# Patient Record
Sex: Female | Born: 1990 | Race: Black or African American | Hispanic: No | Marital: Single | Smoking: Never smoker
Health system: Southern US, Community
[De-identification: ages and names within clinical notes are randomized; demographics above are authoritative.]

## PROBLEM LIST (undated history)

## (undated) DIAGNOSIS — F410 Panic disorder [episodic paroxysmal anxiety] without agoraphobia: Secondary | ICD-10-CM

## (undated) DIAGNOSIS — F419 Anxiety disorder, unspecified: Secondary | ICD-10-CM

## (undated) DIAGNOSIS — T7840XA Allergy, unspecified, initial encounter: Secondary | ICD-10-CM

## (undated) HISTORY — DX: Allergy, unspecified, initial encounter: T78.40XA

## (undated) HISTORY — PX: NO PAST SURGERIES: SHX2092

## (undated) HISTORY — DX: Anxiety disorder, unspecified: F41.9

## (undated) HISTORY — DX: Panic disorder (episodic paroxysmal anxiety): F41.0

---

## 2012-06-03 ENCOUNTER — Encounter (HOSPITAL_COMMUNITY): Payer: Self-pay | Admitting: Emergency Medicine

## 2012-06-03 ENCOUNTER — Emergency Department (HOSPITAL_COMMUNITY)
Admission: EM | Admit: 2012-06-03 | Discharge: 2012-06-03 | Disposition: A | Discharge status: Home / Self Care | Payer: Self-pay | Attending: Emergency Medicine | Admitting: Emergency Medicine

## 2012-06-03 DIAGNOSIS — L290 Pruritus ani: Secondary | ICD-10-CM | POA: Insufficient documentation

## 2012-06-03 DIAGNOSIS — R21 Rash and other nonspecific skin eruption: Secondary | ICD-10-CM | POA: Insufficient documentation

## 2012-06-03 MED ORDER — DIPHENHYDRAMINE HCL 25 MG PO TABS: 25.0000 mg | ORAL_TABLET | Freq: Four times a day (QID) | ORAL | Status: DC

## 2012-06-03 MED ORDER — HYDROCORTISONE 1 % EX CREA: TOPICAL_CREAM | Freq: Two times a day (BID) | CUTANEOUS | Status: DC

## 2012-06-03 NOTE — ED Notes (Signed)
Pt reports noticing a rash 2-3 days ago, pt noticed rash to hand and chest and back; c/o itching

## 2012-06-03 NOTE — ED Provider Notes (Signed)
History   This chart was scribed for Tobin Chad, MD by Albertha Ghee Rifaie. This patient was seen in room TR10C/TR10C and the patient's care was started at 6:36 PM.   CSN: [id number]  Arrival date & time 06/03/12  1547   None     Chief Complaint  Patient presents with  . Rash     The history is provided by the patient. No language interpreter was used.    Courtney Mcdaniel is a 21 y.o. female who presents to the Emergency Department complaining of 3 days of rash on hands, chest and back associated with itching. She reports having a  Family hx of rash; her husband has rash on his sleeve arm. She states that it is not an infection.  She denies fever, chills, emesis, and nausea as associated symptoms. Pt denies smoking but states alcohol use. She denies having any new health problem or change in behavior. She has not been hospitalized before and is not taking any medications currently. She is concerned that these areas may be secondary to mosquito bites or bedbugs.  She reports no history of insect infestations of her home.   History reviewed. No pertinent past medical history.  History reviewed. No pertinent past surgical history.  History reviewed. No pertinent family history.  History  Substance Use Topics  . Smoking status: Never Smoker   . Smokeless tobacco: Not on file  . Alcohol Use: Yes     1 -2 glasses of wine daily   NO OB history provided.   Review of Systems  Constitutional: Negative.   HENT: Negative.   Eyes: Negative.   Respiratory: Negative.   Cardiovascular: Negative.   Gastrointestinal: Negative.   Genitourinary: Negative.   Musculoskeletal: Negative.   Skin: Positive for rash.  Neurological: Negative.   Hematological: Negative.   Psychiatric/Behavioral: Negative.   All other systems reviewed and are negative.    Allergies  Review of patient's allergies indicates no known allergies.  Home Medications  No current outpatient prescriptions on  file.  BP 113/72  Pulse 70  Temp 97.6 F (36.4 C) (Oral)  Resp 18  SpO2 99%  LMP 05/29/2012  Physical Exam  Constitutional: She is oriented to person, place, and time. She appears well-developed and well-nourished. No distress.  HENT:  Head: Normocephalic and atraumatic. Head is without raccoon's eyes, without contusion, without right periorbital erythema and without left periorbital erythema. No trismus in the jaw.  Right Ear: External ear normal. No mastoid tenderness.  Left Ear: External ear normal. No mastoid tenderness.  Nose: Nose normal. No mucosal edema, rhinorrhea or nasal septal hematoma.  Mouth/Throat: Uvula is midline, oropharynx is clear and moist and mucous membranes are normal. Mucous membranes are not pale, not dry and not cyanotic. She does not have dentures. No oral lesions. Normal dentition. No dental abscesses, uvula swelling, lacerations or dental caries. No oropharyngeal exudate, posterior oropharyngeal edema, posterior oropharyngeal erythema or tonsillar abscesses.  Eyes: EOM are normal. Pupils are equal, round, and reactive to light. Right eye exhibits no discharge. Left eye exhibits no discharge. No scleral icterus.  Neck: Normal range of motion. Neck supple. No JVD present. No tracheal deviation present. No thyromegaly present.  Cardiovascular: Normal rate, regular rhythm, normal heart sounds and intact distal pulses.  Exam reveals no gallop and no friction rub.   No murmur heard. Pulmonary/Chest: Effort normal and breath sounds normal. No stridor. No respiratory distress. She has no wheezes. She has no rales. She exhibits no  tenderness.  Abdominal: She exhibits no distension and no mass. There is no tenderness. There is no rebound and no guarding.  Genitourinary: Guaiac negative stool. No vaginal discharge found.  Musculoskeletal: Normal range of motion. She exhibits no edema and no tenderness.  Lymphadenopathy:       Head (right side): No submental, no  submandibular, no tonsillar, no preauricular, no posterior auricular and no occipital adenopathy present.       Head (left side): No submental, no submandibular, no tonsillar, no preauricular, no posterior auricular and no occipital adenopathy present.    She has no cervical adenopathy.  Neurological: She is alert and oriented to person, place, and time. She displays normal reflexes. No cranial nerve deficit. She exhibits normal muscle tone. Coordination normal. GCS eye subscore is 4. GCS verbal subscore is 5. GCS motor subscore is 6.  Skin: Skin is warm. Rash noted. She is not diaphoretic. No erythema. No pallor.  Psychiatric: She has a normal mood and affect. Her behavior is normal. Judgment normal.    ED Course  Procedures (including critical care time)  Labs Reviewed - No data to display No results found.  DIAGNOSTIC STUDIES: Oxygen Saturation is 99% on room air, normal by my interpretation.    COORDINATION OF CARE: 6:40 PM Discussed treatment plan with pt at bedside and pt agreed to plan.    No diagnosis found.    MDM  Pt presents for evaluation of a rash.  She has several very small, not well circumscribed, reddened areas of skin on her right arm/hand, the side of her neck, and trunk.  There are no lesions on the palms or soles.  The rash does not appear to be urticarial or cellulitic.  She has no adenopathy or evidence of systemic illness.  Plan close outpt follow-up and prn benadryl.  She has been advised to return to the emergency department if the areas increase in size or if it migrates.      I personally performed the services described in this documentation, which was scribed in my presence. The recorded information has been reviewed and considered. Tobin Chad, MD 06/03/12 2027

## 2012-08-08 ENCOUNTER — Emergency Department (HOSPITAL_COMMUNITY)
Admission: EM | Admit: 2012-08-08 | Discharge: 2012-08-08 | Disposition: A | Discharge status: Home / Self Care | Payer: BC Managed Care – PPO | Attending: Emergency Medicine | Admitting: Emergency Medicine

## 2012-08-08 ENCOUNTER — Encounter (HOSPITAL_COMMUNITY): Payer: Self-pay | Admitting: Nurse Practitioner

## 2012-08-08 DIAGNOSIS — J3489 Other specified disorders of nose and nasal sinuses: Secondary | ICD-10-CM | POA: Insufficient documentation

## 2012-08-08 DIAGNOSIS — J069 Acute upper respiratory infection, unspecified: Secondary | ICD-10-CM | POA: Insufficient documentation

## 2012-08-08 MED ORDER — GUAIFENESIN-CODEINE 100-10 MG/5ML PO SOLN
5.0000 mL | Freq: Once | ORAL | Status: AC
  Administered 2012-08-08: 5 mL via ORAL
  Filled 2012-08-08: qty 5

## 2012-08-08 NOTE — ED Notes (Signed)
States "coughing up green stuff" since yesterday. Body aches Thursday that have resolved. Denies any other complaints beside cough today

## 2012-08-11 NOTE — ED Provider Notes (Signed)
History    22 year old female with cough and congestion. Onset yesterday. Mrs. preceded by having body aches which has since resolved. No fever or chills. No nausea or vomiting. No unusual leg pain or swelling. No sick contacts. No rash. No history of underlying lung disease. Nonsmoker.   CSN: [id number]  Arrival date & time 08/08/12  1307   First MD Initiated Contact with Patient 08/08/12 1351      Chief Complaint  Patient presents with  . URI    (Consider location/radiation/quality/duration/timing/severity/associated sxs/prior treatment) HPI  History reviewed. No pertinent past medical history.  History reviewed. No pertinent past surgical history.  History reviewed. No pertinent family history.  History  Substance Use Topics  . Smoking status: Never Smoker   . Smokeless tobacco: Not on file  . Alcohol Use: Yes     Comment: 1 -2 glasses of wine daily    OB History    Grav Para Term Preterm Abortions TAB SAB Ect Mult Living                  Review of Systems  All systems reviewed and negative, other than as noted in HPI.   Allergies  Review of patient's allergies indicates no known allergies.  Home Medications  No current outpatient prescriptions on file.  BP 110/66  Pulse 102  Temp 97.9 F (36.6 C) (Oral)  Resp 16  SpO2 100%  LMP 08/01/2012  Physical Exam  Nursing note and vitals reviewed. Constitutional: She appears well-developed and well-nourished. No distress.  HENT:  Head: Normocephalic and atraumatic.  Eyes: Conjunctivae normal are normal. Right eye exhibits no discharge. Left eye exhibits no discharge.  Neck: Neck supple.  Cardiovascular: Normal rate, regular rhythm and normal heart sounds.  Exam reveals no gallop and no friction rub.   No murmur heard. Pulmonary/Chest: Effort normal and breath sounds normal. No respiratory distress.  Abdominal: Soft. She exhibits no distension. There is no tenderness.  Musculoskeletal: She exhibits no  edema and no tenderness.       Lower extremities symmetric as compared to each other. No calf tenderness. Negative Homan's. No palpable cords.   Neurological: She is alert.  Skin: Skin is warm and dry.  Psychiatric: She has a normal mood and affect. Her behavior is normal. Thought content normal.    ED Course  Procedures (including critical care time)  Labs Reviewed - No data to display No results found.   1. Viral URI       MDM  22 year old female with likely viral upper respiratory tract infection. Well appearing. No respiratory distress. Oxygen saturations 100% on room air. Very low suspicion for serious bacterial illness. Doubt pulmonary embolism. Doubt pneumothorax. Plan symptomatic treatment. Return precautions discussed.        Raeford Razor, MD 08/11/12 [phone]

## 2014-10-05 ENCOUNTER — Encounter (HOSPITAL_COMMUNITY): Payer: Self-pay | Admitting: *Deleted

## 2014-10-05 ENCOUNTER — Emergency Department (HOSPITAL_COMMUNITY)
Admission: EM | Admit: 2014-10-05 | Discharge: 2014-10-05 | Disposition: A | Discharge status: Home / Self Care | Payer: BLUE CROSS/BLUE SHIELD | Attending: Emergency Medicine | Admitting: Emergency Medicine

## 2014-10-05 DIAGNOSIS — F410 Panic disorder [episodic paroxysmal anxiety] without agoraphobia: Secondary | ICD-10-CM

## 2014-10-05 MED ORDER — HYDROXYZINE HCL 25 MG PO TABS: 25.0000 mg | ORAL_TABLET | Freq: Three times a day (TID) | ORAL | Status: DC | PRN

## 2014-10-05 NOTE — Discharge Instructions (Signed)
Your symptoms may be related to an anxiety attack.  However, follow up with your doctor for further evaluation including a thyroid evaluation.  Take Hydroxyzine as needed if your symptom return.   Panic Attacks Panic attacks are sudden, short-livedsurges of severe anxiety, fear, or discomfort. They may occur for no reason when you are relaxed, when you are anxious, or when you are sleeping. Panic attacks may occur for a number of reasons:   Healthy people occasionally have panic attacks in extreme, life-threatening situations, such as war or natural disasters. Normal anxiety is a protective mechanism of the body that helps us react to danger (fight or flight response).  Panic attacks are often seen with anxiety disorders, such as panic disorder, social anxiety disorder, generalized anxiety disorder, and phobias. Anxiety disorders cause excessive or uncontrollable anxiety. They may interfere with your relationships or other life activities.  Panic attacks are sometimes seen with other mental illnesses, such as depression and posttraumatic stress disorder.  Certain medical conditions, prescription medicines, and drugs of abuse can cause panic attacks. SYMPTOMS  Panic attacks start suddenly, peak within 20 minutes, and are accompanied by four or more of the following symptoms:  Pounding heart or fast heart rate (palpitations).  Sweating.  Trembling or shaking.  Shortness of breath or feeling smothered.  Feeling choked.  Chest pain or discomfort.  Nausea or strange feeling in your stomach.  Dizziness, light-headedness, or feeling like you will faint.  Chills or hot flushes.  Numbness or tingling in your lips or hands and feet.  Feeling that things are not real or feeling that you are not yourself.  Fear of losing control or going crazy.  Fear of dying. Some of these symptoms can mimic serious medical conditions. For example, you may think you are having a heart attack. Although  panic attacks can be very scary, they are not life threatening. DIAGNOSIS  Panic attacks are diagnosed through an assessment by your health care provider. Your health care provider will ask questions about your symptoms, such as where and when they occurred. Your health care provider will also ask about your medical history and use of alcohol and drugs, including prescription medicines. Your health care provider may order blood tests or other studies to rule out a serious medical condition. Your health care provider may refer you to a mental health professional for further evaluation. TREATMENT   Most healthy people who have one or two panic attacks in an extreme, life-threatening situation will not require treatment.  The treatment for panic attacks associated with anxiety disorders or other mental illness typically involves counseling with a mental health professional, medicine, or a combination of both. Your health care provider will help determine what treatment is best for you.  Panic attacks due to physical illness usually go away with treatment of the illness. If prescription medicine is causing panic attacks, talk with your health care provider about stopping the medicine, decreasing the dose, or substituting another medicine.  Panic attacks due to alcohol or drug abuse go away with abstinence. Some adults need professional help in order to stop drinking or using drugs. HOME CARE INSTRUCTIONS   Take all medicines as directed by your health care provider.   Schedule and attend follow-up visits as directed by your health care provider. It is important to keep all your appointments. SEEK MEDICAL CARE IF:  You are not able to take your medicines as prescribed.  Your symptoms do not improve or get worse. SEEK IMMEDIATE MEDICAL CARE  IF:   You experience panic attack symptoms that are different than your usual symptoms.  You have serious thoughts about hurting yourself or others.  You  are taking medicine for panic attacks and have a serious side effect. MAKE SURE YOU:  Understand these instructions.  Will watch your condition.  Will get help right away if you are not doing well or get worse. Document Released: 07/22/2005 Document Revised: 07/27/2013 Document Reviewed: 03/05/2013 [facility] Patient Information 2015 [place], [place]. This information is not intended to replace advice given to you by your health care provider. Make sure you discuss any questions you have with your health care provider.

## 2014-10-05 NOTE — ED Notes (Signed)
Patient presents via EMS  EMS reports upon their arrival patient was hyperventilating with arm tingling  Skin was warm and dry

## 2014-10-05 NOTE — ED Notes (Signed)
Pt. Admits to a family member passing away this past Friday as a stressor in pt.'s life

## 2014-10-05 NOTE — ED Provider Notes (Signed)
CSN: [id number]     Arrival date & time 10/05/14  0245 History   First MD Initiated Contact with Patient 10/05/14 [phone]     Chief Complaint  Patient presents with  . Panic Attack     (Consider location/radiation/quality/duration/timing/severity/associated sxs/prior Treatment) HPI   Generally healthy 24 year old female presents for evaluation of heart palpitation. Patient report last night she was using the bathroom when she experiencing heart racing, a sharp pain to the jaw, and having transient shortness of breath. Symptom seems to last approximately 30 minutes and resolved. She also report having recent past having difficulty catching her breath in excluding significant tearing sensation throughout her arms. She admits that she has similar panic attack in the past. She mentioned that a family member recently passed away but she does not think her heart palpitation is related to that. She however reports she had a dream about her family member that has passed prior to her reported episode.  She denies fever, chills, headache, lightheadedness, dizziness, chest pressure, URI symptoms, productive cough, back pain, abdominal pain, nausea vomiting diarrhea. She denies any history of thyroid disease or heart disease. She denies any recent caffeine use. She has not been sexually active for nearly a year. She denies any prior history of PE or DVT, no recent surgery, prolonged bed rest, unilateral leg swelling or calf pain, hemoptysis, or taking oral birth control. Symptom has resolved.  History reviewed. No pertinent past medical history. History reviewed. No pertinent past surgical history. No family history on file. History  Substance Use Topics  . Smoking status: Never Smoker   . Smokeless tobacco: Never Used  . Alcohol Use: Yes     Comment: 1 -2 glasses of wine daily   OB History    No data available     Review of Systems  All other systems reviewed and are negative.     Allergies   Review of patient's allergies indicates no known allergies.  Home Medications   Prior to Admission medications   Medication Sig Start Date End Date Taking? Authorizing Provider  guaiFENesin (MUCINEX) 600 MG 12 hr tablet Take 600 mg by mouth 2 (two) times daily as needed for cough or to loosen phlegm.   Yes Historical Provider, MD   BP 108/77 mmHg  Pulse 75  Temp(Src) 97.6 F (36.4 C) (Oral)  Resp 18  Ht  (1.575 m)  Wt 115 lb (52.164 kg)  BMI 21.03 kg/m2  SpO2 100%  LMP 09/19/2014 Physical Exam  Constitutional: She is oriented to person, place, and time. She appears well-developed and well-nourished. No distress.  HENT:  Head: Atraumatic.  Eyes: Conjunctivae are normal.  Neck: Neck supple.  Cardiovascular: Normal rate, regular rhythm and intact distal pulses.  Exam reveals no gallop and no friction rub.   No murmur heard. Pulmonary/Chest: Effort normal and breath sounds normal.  Abdominal: Soft. There is no tenderness.  Musculoskeletal: She exhibits no edema.  Neurological: She is alert and oriented to person, place, and time.  Skin: No rash noted.  Psychiatric: She has a normal mood and affect. Her speech is normal and behavior is normal. Thought content normal.  Nursing note and vitals reviewed.   ED Course  Procedures (including critical care time)  6:18 AM Patient experiencing a brief period of heart palpitation, arm tingling, and hyperventilation that is suspicious of an anxiety or panic attack.  sxs has resolved. Precipitating factor likely from recent death in the family. No hx of thyroid disease.  No  cardiac disease.  6:54 AM ECG without arrhthymias.  Reassurance given. Hydroxyzine prescribed to use as needed. Recommend follow-up closely with PCP for outpatient evaluation including thyroid study. Return precautions discussed. Pt is PERC negative.  Care discussed with Dr. Norlene Campbelltter.  Labs Review Labs Reviewed - No data to display  Imaging Review No results  found.   EKG Interpretation   Date/Time:  Wednesday October 05 2014 06:26:17 EST Ventricular Rate:  83 PR Interval:  141 QRS Duration: 73 QT Interval:  373 QTC Calculation: 438 R Axis:   68 Text Interpretation:  Sinus rhythm Probable left atrial enlargement Low  voltage, precordial leads Confirmed by OTTER  MD, OLGA ([id number]) on 10/05/2014  6:48:50 AM      MDM   Final diagnoses:  Panic attack    BP 108/77 mmHg  Pulse 75  Temp(Src) 97.6 F (36.4 C) (Oral)  Resp 18  Ht 5\' 2"  (1.575 m)  Wt 115 lb (52.164 kg)  BMI 21.03 kg/m2  SpO2 100%  LMP 09/19/2014     Fayrene HelperBowie Mariyanna Mucha, PA-C 10/05/14 0703  Olivia Mackielga M Otter, MD 10/05/14 [phone]

## 2015-09-19 ENCOUNTER — Other Ambulatory Visit (HOSPITAL_COMMUNITY)
Admission: RE | Admit: 2015-09-19 | Discharge: 2015-09-19 | Disposition: A | Discharge status: Home / Self Care | Payer: Self-pay | Source: Ambulatory Visit | Attending: Emergency Medicine | Admitting: Emergency Medicine

## 2015-09-19 ENCOUNTER — Encounter (HOSPITAL_COMMUNITY): Payer: Self-pay | Admitting: Emergency Medicine

## 2015-09-19 ENCOUNTER — Emergency Department (INDEPENDENT_AMBULATORY_CARE_PROVIDER_SITE_OTHER)
Admission: EM | Admit: 2015-09-19 | Discharge: 2015-09-19 | Disposition: A | Discharge status: Home / Self Care | Payer: Self-pay | Source: Home / Self Care | Attending: Emergency Medicine | Admitting: Emergency Medicine

## 2015-09-19 DIAGNOSIS — N390 Urinary tract infection, site not specified: Secondary | ICD-10-CM

## 2015-09-19 LAB — POCT PREGNANCY, URINE: Preg Test, Ur: NEGATIVE

## 2015-09-19 LAB — POCT URINALYSIS DIP (DEVICE)
Bilirubin Urine: NEGATIVE
GLUCOSE, UA: NEGATIVE mg/dL
Hgb urine dipstick: NEGATIVE
Ketones, ur: NEGATIVE mg/dL
LEUKOCYTES UA: NEGATIVE
NITRITE: NEGATIVE
PROTEIN: NEGATIVE mg/dL
Specific Gravity, Urine: 1.01 (ref 1.005–1.030)
UROBILINOGEN UA: 0.2 mg/dL (ref 0.0–1.0)
pH: 7 (ref 5.0–8.0)

## 2015-09-19 MED ORDER — CEPHALEXIN 500 MG PO CAPS: 500.0000 mg | ORAL_CAPSULE | Freq: Four times a day (QID) | ORAL | Status: DC

## 2015-09-19 NOTE — ED Notes (Signed)
uti check, onset 4/11.  Reports stinging

## 2015-09-19 NOTE — Discharge Instructions (Signed)
You likely have a urinary tract infection. Take Keflex 4 times a day for 3 days. Make sure you are drinking plenty of water. Follow-up as needed.

## 2015-09-19 NOTE — ED Provider Notes (Signed)
CSN: [id number]     Arrival date & time 09/19/15  1343 History   First MD Initiated Contact with Patient 09/19/15 1424     Chief Complaint  Patient presents with  . Urinary Tract Infection   (Consider location/radiation/quality/duration/timing/severity/associated sxs/prior Treatment) HPI  She is a 25 year old woman here for evaluation of dysuria. She states she had some dysuria on Sunday. This is associated with some urinary urgency. No abdominal pain, nausea, vomiting, flank pain, fever, or chills. She has been drinking cranberry juice and water. She states symptoms got a little bit better yesterday, but then again had dysuria today. She states she has had one UTI previously when she was 25 years old.  No vaginal discharge.  History reviewed. No pertinent past medical history. History reviewed. No pertinent past surgical history. Family History  Problem Relation Age of Onset  . Diabetes Father    Social History  Substance Use Topics  . Smoking status: Never Smoker   . Smokeless tobacco: Never Used  . Alcohol Use: Yes     Comment: 1 -2 glasses of wine daily   OB History    No data available     Review of Systems As in history of present illness Allergies  Review of patient's allergies indicates no known allergies.  Home Medications   Prior to Admission medications   Medication Sig Start Date End Date Taking? Authorizing Provider  cephALEXin (KEFLEX) 500 MG capsule Take 1 capsule (500 mg total) by mouth 4 (four) times daily. 09/19/15   Charm Rings, MD  guaiFENesin (MUCINEX) 600 MG 12 hr tablet Take 600 mg by mouth 2 (two) times daily as needed for cough or to loosen phlegm.    Historical Provider, MD  hydrOXYzine (ATARAX/VISTARIL) 25 MG tablet Take 1 tablet (25 mg total) by mouth every 8 (eight) hours as needed for anxiety. 10/05/14   Fayrene Helper, PA-C   Meds Ordered and Administered this Visit  Medications - No data to display  BP 98/70 mmHg  Pulse 64  Temp(Src) 98.3 F  (36.8 C) (Oral)  Resp 16  SpO2 100%  LMP 09/08/2015 No data found.   Physical Exam  Constitutional: She is oriented to person, place, and time. She appears well-developed and well-nourished. No distress.  Cardiovascular: Normal rate.   Pulmonary/Chest: Effort normal.  Abdominal: Soft. Bowel sounds are normal. She exhibits no distension. There is no tenderness. There is no rebound and no guarding.  No CVA tenderness  Neurological: She is alert and oriented to person, place, and time.    ED Course  Procedures (including critical care time)  Labs Review Labs Reviewed  URINE CULTURE  POCT URINALYSIS DIP (DEVICE)  POCT PREGNANCY, URINE    Imaging Review No results found.    MDM   1. UTI (lower urinary tract infection)    UA is clear, but she has been drinking a lot of water and cranberry juice. Based on her symptoms she does have a UTI. Will treat with Keflex. Urine sent for culture.    Charm Rings, MD 09/19/15 1500

## 2015-09-21 ENCOUNTER — Telehealth (HOSPITAL_COMMUNITY): Payer: Self-pay | Admitting: Emergency Medicine

## 2015-09-21 LAB — URINE CULTURE

## 2015-09-21 NOTE — ED Notes (Signed)
Called pt and notified of recent lab results from visit 2/14 Pt ID'd properly... Reports feeling better and sx have subsided Will finish keflex tomorrow... Has 7 pills left... Adv pt to finish   Per Dr. Dayton Scrape,  Urine culture does not clearly demonstrate urinary tract infection. Finish keflex (cephalexin) prescription given at urgent care on 09/19/15.  Recheck or followup pcp/Tiffany Danelle Earthly for persistent symptoms. Ria Clock MD  Adv pt if sx are not getting better to return  Pt verb understanding.

## 2016-03-05 ENCOUNTER — Ambulatory Visit (HOSPITAL_COMMUNITY)
Admission: EM | Admit: 2016-03-05 | Discharge: 2016-03-05 | Disposition: A | Discharge status: Home / Self Care | Payer: Self-pay | Attending: Family Medicine | Admitting: Family Medicine

## 2016-03-05 ENCOUNTER — Encounter (HOSPITAL_COMMUNITY): Payer: Self-pay | Admitting: Emergency Medicine

## 2016-03-05 DIAGNOSIS — N3090 Cystitis, unspecified without hematuria: Secondary | ICD-10-CM

## 2016-03-05 LAB — POCT URINALYSIS DIP (DEVICE)
BILIRUBIN URINE: NEGATIVE
GLUCOSE, UA: NEGATIVE mg/dL
KETONES UR: NEGATIVE mg/dL
LEUKOCYTES UA: NEGATIVE
Nitrite: NEGATIVE
Protein, ur: NEGATIVE mg/dL
SPECIFIC GRAVITY, URINE: 1.015 (ref 1.005–1.030)
UROBILINOGEN UA: 0.2 mg/dL (ref 0.0–1.0)
pH: 6 (ref 5.0–8.0)

## 2016-03-05 LAB — POCT PREGNANCY, URINE: Preg Test, Ur: NEGATIVE

## 2016-03-05 MED ORDER — CEPHALEXIN 500 MG PO CAPS: 500.0000 mg | ORAL_CAPSULE | Freq: Four times a day (QID) | ORAL | 0 refills | Status: DC

## 2016-03-05 NOTE — Discharge Instructions (Signed)
Take all of medicine as directed, drink lots of fluids, see your doctor if further problems. °

## 2016-03-05 NOTE — ED Provider Notes (Signed)
[facility]    CSN: [id number] Arrival date & time: 03/05/16  1204  First Provider Contact:  First MD Initiated Contact with Patient 03/05/16 1308        History   Chief Complaint Chief Complaint  Patient presents with  . Urinary Tract Infection    HPI Courtney Mcdaniel is a 25 y.o. female.    Urinary Frequency  This is a new problem. The current episode started more than 1 week ago. The problem has been gradually worsening. Pertinent negatives include no abdominal pain.    History reviewed. No pertinent past medical history.  There are no active problems to display for this patient.   History reviewed. No pertinent surgical history.  OB History    No data available       Home Medications    Prior to Admission medications   Medication Sig Start Date End Date Taking? Authorizing Provider  cephALEXin (KEFLEX) 500 MG capsule Take 1 capsule (500 mg total) by mouth 4 (four) times daily. Take all of medicine and drink lots of fluids 03/05/16   Linna Hoff, MD  guaiFENesin (MUCINEX) 600 MG 12 hr tablet Take 600 mg by mouth 2 (two) times daily as needed for cough or to loosen phlegm.    Historical Provider, MD  hydrOXYzine (ATARAX/VISTARIL) 25 MG tablet Take 1 tablet (25 mg total) by mouth every 8 (eight) hours as needed for anxiety. 10/05/14   Fayrene Helper, PA-C    Family History Family History  Problem Relation Age of Onset  . Diabetes Father     Social History Social History  Substance Use Topics  . Smoking status: Never Smoker  . Smokeless tobacco: Never Used  . Alcohol use Yes     Comment: 1 -2 glasses of wine daily     Allergies   Review of patient's allergies indicates no known allergies.   Review of Systems Review of Systems  Constitutional: Negative.  Negative for chills and fever.  Gastrointestinal: Negative.  Negative for abdominal pain, nausea and vomiting.  Genitourinary: Positive for dysuria, frequency and urgency. Negative for flank  pain, hematuria, pelvic pain and vaginal discharge.  All other systems reviewed and are negative.    Physical Exam Triage Vital Signs ED Triage Vitals [03/05/16 1227]  Enc Vitals Group     BP 100/68     Pulse Rate 67     Resp      Temp 97.8 F (36.6 C)     Temp Source Oral     SpO2 98 %     Weight      Height      Head Circumference      Peak Flow      Pain Score      Pain Loc      Pain Edu?      Excl. in GC?    No data found.   Updated Vital Signs BP 100/68 (BP Location: Left Arm)   Pulse 67   Temp 97.8 F (36.6 C) (Oral)   SpO2 98%   Visual Acuity Right Eye Distance:   Left Eye Distance:   Bilateral Distance:    Right Eye Near:   Left Eye Near:    Bilateral Near:     Physical Exam  Constitutional: She is oriented to person, place, and time. She appears well-developed and well-nourished. No distress.  Abdominal: Soft. Bowel sounds are normal. She exhibits no mass. There is no tenderness.  Neurological: She is alert  and oriented to person, place, and time.  Skin: Skin is warm and dry.  Nursing note and vitals reviewed.    UC Treatments / Results  Labs (all labs ordered are listed, but only abnormal results are displayed) Labs Reviewed  POCT URINALYSIS DIP (DEVICE) - Abnormal; Notable for the following:       Result Value   Hgb urine dipstick TRACE (*)    All other components within normal limits  POCT PREGNANCY, URINE    EKG  EKG Interpretation None       Radiology No results found.  Procedures Procedures (including critical care time)  Medications Ordered in UC Medications - No data to display   Initial Impression / Assessment and Plan / UC Course  I have reviewed the triage vital signs and the nursing notes.  Pertinent labs & imaging results that were available during my care of the patient were reviewed by me and considered in my medical decision making (see chart for details).  Clinical Course     Final Clinical  Impressions(s) / UC Diagnoses   Final diagnoses:  Cystitis    New Prescriptions Discharge Medication List as of 03/05/2016  1:41 PM       Linna Hoff, MD 03/12/16 2048

## 2016-11-04 ENCOUNTER — Encounter (HOSPITAL_COMMUNITY): Payer: Self-pay | Admitting: Emergency Medicine

## 2016-11-04 ENCOUNTER — Ambulatory Visit (HOSPITAL_COMMUNITY)
Admission: EM | Admit: 2016-11-04 | Discharge: 2016-11-04 | Disposition: A | Discharge status: Home / Self Care | Payer: Self-pay | Attending: Family Medicine | Admitting: Family Medicine

## 2016-11-04 DIAGNOSIS — R6889 Other general symptoms and signs: Secondary | ICD-10-CM

## 2016-11-04 MED ORDER — BUTALBITAL-APAP-CAFFEINE 50-325-40 MG PO TABS: 1.0000 | ORAL_TABLET | Freq: Four times a day (QID) | ORAL | 0 refills | Status: AC | PRN

## 2016-11-04 MED ORDER — IPRATROPIUM BROMIDE 0.03 % NA SOLN: 2.0000 | Freq: Two times a day (BID) | NASAL | 0 refills | Status: DC

## 2016-11-04 NOTE — ED Triage Notes (Signed)
Onset Thursday, muscle aches, Friday legs ached.  Yesterday had headaches and coughing and runny nose.

## 2016-11-04 NOTE — ED Provider Notes (Signed)
[facility]    CSN: [id number] Arrival date & time: 11/04/16  1219     History   Chief Complaint Chief Complaint  Patient presents with  . URI    HPI Courtney Mcdaniel is a 26 y.o. female.   Onset Thursday, 4 days ago, patient developed muscle aches, and on Friday her legs ached.  Yesterday had headaches and coughing and runny nose.  She's also had a cough.        History reviewed. No pertinent past medical history.  There are no active problems to display for this patient.   History reviewed. No pertinent surgical history.  OB History    No data available       Home Medications    Prior to Admission medications   Medication Sig Start Date End Date Taking? Authorizing Provider  Phenyleph-Doxylamine-DM-APAP (ALKA-SELTZER PLS NIGHT CLD/FLU PO) Take by mouth.   Yes Historical Provider, MD  butalbital-acetaminophen-caffeine (FIORICET, ESGIC) 50-325-40 MG tablet Take 1-2 tablets by mouth every 6 (six) hours as needed for headache. 11/04/16 11/04/17  Elvina Sidle, MD  hydrOXYzine (ATARAX/VISTARIL) 25 MG tablet Take 1 tablet (25 mg total) by mouth every 8 (eight) hours as needed for anxiety. 10/05/14   Fayrene Helper, PA-C  ipratropium (ATROVENT) 0.03 % nasal spray Place 2 sprays into both nostrils 2 (two) times daily. 11/04/16   Elvina Sidle, MD    Family History Family History  Problem Relation Age of Onset  . Diabetes Father     Social History Social History  Substance Use Topics  . Smoking status: Never Smoker  . Smokeless tobacco: Never Used  . Alcohol use No     Comment: 1 -2 glasses of wine daily     Allergies   Patient has no known allergies.   Review of Systems Review of Systems  Constitutional: Negative.   HENT: Positive for rhinorrhea.   Respiratory: Positive for cough.   Gastrointestinal: Negative.   Musculoskeletal: Positive for myalgias.  Neurological: Positive for headaches.  All other systems reviewed and are  negative.    Physical Exam Triage Vital Signs ED Triage Vitals [11/04/16 1312]  Enc Vitals Group     BP 109/76     Pulse Rate (!) 102     Resp (!) 24     Temp 98.6 F (37 C)     Temp Source Oral     SpO2 99 %     Weight      Height      Head Circumference      Peak Flow      Pain Score 9     Pain Loc      Pain Edu?      Excl. in GC?    No data found.   Updated Vital Signs BP 109/76 (BP Location: Right Arm)   Pulse (!) 102 Comment: notified rn  Temp 98.6 F (37 C) (Oral)   Resp (!) 24 Comment: notified rn  LMP 10/22/2016   SpO2 99%    Physical Exam  Constitutional: She is oriented to person, place, and time. She appears well-developed and well-nourished.  HENT:  Right Ear: External ear normal.  Left Ear: External ear normal.  Mouth/Throat: Oropharynx is clear and moist.  Eyes: Conjunctivae and EOM are normal. Pupils are equal, round, and reactive to light.  Neck: Normal range of motion. Neck supple.  Cardiovascular: Normal rate, regular rhythm and normal heart sounds.   Pulmonary/Chest: Effort normal and breath sounds normal.  Musculoskeletal: Normal range of motion.  Neurological: She is alert and oriented to person, place, and time. No cranial nerve deficit or sensory deficit. She exhibits normal muscle tone. Coordination normal.  Skin: Skin is warm and dry.  Nursing note and vitals reviewed.    UC Treatments / Results  Labs (all labs ordered are listed, but only abnormal results are displayed) Labs Reviewed - No data to display  EKG  EKG Interpretation None       Radiology No results found.  Procedures Procedures (including critical care time)  Medications Ordered in UC Medications - No data to display   Initial Impression / Assessment and Plan / UC Course  I have reviewed the triage vital signs and the nursing notes.  Pertinent labs & imaging results that were available during my care of the patient were reviewed by me and considered  in my medical decision making (see chart for details).     Final Clinical Impressions(s) / UC Diagnoses   Final diagnoses:  Flu-like symptoms    New Prescriptions New Prescriptions   BUTALBITAL-ACETAMINOPHEN-CAFFEINE (FIORICET, ESGIC) 50-325-40 MG TABLET    Take 1-2 tablets by mouth every 6 (six) hours as needed for headache.   IPRATROPIUM (ATROVENT) 0.03 % NASAL SPRAY    Place 2 sprays into both nostrils 2 (two) times daily.     Elvina Sidle, MD 11/04/16 1340

## 2017-07-16 ENCOUNTER — Encounter (HOSPITAL_COMMUNITY): Payer: Self-pay | Admitting: Emergency Medicine

## 2017-07-16 ENCOUNTER — Ambulatory Visit (HOSPITAL_COMMUNITY)
Admission: EM | Admit: 2017-07-16 | Discharge: 2017-07-16 | Disposition: A | Discharge status: Home / Self Care | Payer: BLUE CROSS/BLUE SHIELD | Attending: Family Medicine | Admitting: Family Medicine

## 2017-07-16 DIAGNOSIS — S0990XA Unspecified injury of head, initial encounter: Secondary | ICD-10-CM | POA: Diagnosis not present

## 2017-07-16 MED ORDER — DICLOFENAC SODIUM 75 MG PO TBEC: 75.0000 mg | DELAYED_RELEASE_TABLET | Freq: Two times a day (BID) | ORAL | 0 refills | Status: DC

## 2017-07-16 NOTE — Discharge Instructions (Signed)
Please seek prompt medical care if: You have: A very bad (severe) headache that is not helped by medicine. Trouble walking or weakness in your arms and legs. Clear or bloody fluid coming from your nose or ears. Changes in your seeing (vision). Jerky movements that you cannot control (seizure). You throw up (vomit). Your symptoms get worse. You lose balance. Your speech is slurred. You pass out. You are sleepier and have trouble staying awake. The black centers of your eyes (pupils) change in size. These symptoms may be an emergency. Do not wait to see if the symptoms will go away. Get medical help right away. Call your local emergency services (911 in the [place] Do not drive yourself to the hospital.

## 2017-07-16 NOTE — ED Triage Notes (Signed)
PT slipped on ice and fell backwards. PT struck back of head on parking lot. PT reports no LOC. PT denies nausea, confusion, tiredness. PT reports soreness to scalp. PT reports neck stiffness as well.

## 2017-07-21 NOTE — ED Provider Notes (Signed)
  [facility]   [id number] 07/16/17 Arrival Time: 1651  ASSESSMENT & PLAN:  1. Injury of head, initial encounter     Meds ordered this encounter  Medications  . diclofenac (VOLTAREN) 75 MG EC tablet    Sig: Take 1 tablet (75 mg total) by mouth 2 (two) times daily.    Dispense:  14 tablet    Refill:  0   Observation. Head injury precautions given. NSAID with food. May f/u if not continuing to show improvement over the next few days; ED with any new or significantly worsening symptoms.  Reviewed expectations re: course of current medical issues. Questions answered. Outlined signs and symptoms indicating need for more acute intervention. Patient verbalized understanding. After Visit Summary given.   SUBJECTIVE:  Courtney Mcdaniel is a 26 y.o. female who reports hitting the back of her head when she fell in a parking lot yesterday. No LOC. Mild headache that is improving. Reports "soreness" of scalp. No bleeding. Ambulatory since fall without dysequilibrium. Vision and hearing normal. No associated n/v. Mild stiffness of neck. No extremity sensation changes or weakness. OTC Tylenol taken. No PMH of concussion reported.  ROS: As per HPI.   OBJECTIVE:  Vitals:   07/16/17 1750 07/16/17 1751  BP: 105/70   Pulse: 86   Resp: 16   Temp: 98 F (36.7 C)   TempSrc: Oral   SpO2: 100%   Weight:  111 lb (50.3 kg)  Height:  5\' 2"  (1.575 m)    General appearance: alert; no distress HEENT: no signs of trauma; some tenderness over superior scalp without wounds present Neck: FROM without midline or muscular tenderness Extremities: no cyanosis or edema; symmetrical with no gross deformities CV: normal extremity capillary refill Skin: warm and dry Neurologic: normal gait; normal symmetric reflexes in all extremities; normal sensation Psychological: alert and cooperative; normal mood and affect   No Known Allergies  No FH of seizures or brain problems.   Social History    Socioeconomic History  . Marital status: Single    Spouse name: Not on file  . Number of children: Not on file  . Years of education: Not on file  . Highest education level: Not on file  Social Needs  . Financial resource strain: Not on file  . Food insecurity - worry: Not on file  . Food insecurity - inability: Not on file  . Transportation needs - medical: Not on file  . Transportation needs - non-medical: Not on file  Occupational History  . Not on file  Tobacco Use  . Smoking status: Never Smoker  . Smokeless tobacco: Never Used  Substance and Sexual Activity  . Alcohol use: No    Comment: 1 -2 glasses of wine daily  . Drug use: No  . Sexual activity: Not on file  Other Topics Concern  . Not on file  Social History Narrative  . Not on file   Family History  Problem Relation Age of Onset  . Diabetes Father       Mardella LaymanHagler, Olsen Mccutchan, MD 07/21/17 [id number]

## 2017-08-11 ENCOUNTER — Encounter (HOSPITAL_COMMUNITY): Payer: Self-pay | Admitting: Emergency Medicine

## 2017-08-11 ENCOUNTER — Ambulatory Visit (HOSPITAL_COMMUNITY)
Admission: EM | Admit: 2017-08-11 | Discharge: 2017-08-11 | Disposition: A | Discharge status: Home / Self Care | Payer: BLUE CROSS/BLUE SHIELD

## 2017-08-11 DIAGNOSIS — G8929 Other chronic pain: Secondary | ICD-10-CM

## 2017-08-11 DIAGNOSIS — R197 Diarrhea, unspecified: Secondary | ICD-10-CM

## 2017-08-11 DIAGNOSIS — R1032 Left lower quadrant pain: Secondary | ICD-10-CM

## 2017-08-11 DIAGNOSIS — K5900 Constipation, unspecified: Secondary | ICD-10-CM | POA: Diagnosis not present

## 2017-08-11 DIAGNOSIS — M25562 Pain in left knee: Secondary | ICD-10-CM

## 2017-08-11 DIAGNOSIS — R1030 Lower abdominal pain, unspecified: Secondary | ICD-10-CM

## 2017-08-11 MED ORDER — DOCUSATE SODIUM 50 MG PO CAPS: 50.0000 mg | ORAL_CAPSULE | Freq: Two times a day (BID) | ORAL | 0 refills | Status: DC

## 2017-08-11 MED ORDER — MELOXICAM 7.5 MG PO TABS: 7.5000 mg | ORAL_TABLET | Freq: Every day | ORAL | 1 refills | Status: DC

## 2017-08-11 MED ORDER — POLYETHYLENE GLYCOL 3350 17 G PO PACK: 17.0000 g | PACK | Freq: Every day | ORAL | 0 refills | Status: DC | PRN

## 2017-08-11 NOTE — ED Triage Notes (Signed)
PT reports lower abdominal pain that started Friday. Diarrhea Friday night, but not since then. Denies dysuria. PT reports left knee pain when walking on stairs.

## 2017-08-11 NOTE — Discharge Instructions (Signed)
Please use Miralax for moderate to severe constipation. Take this once a day for the next 2-3 days. Please also start docusate stool softener, twice a day for at least 1 week. If stools become loose, cut down to once a day for another week. If stools remain loose, cut back to 1 pill every other day for a third week. You can stop docusate thereafter and resume as needed for constipation.  To help reduce constipation and promote bowel health: 1. Drink at least 64 ounces of water each day 2. Eat plenty of fiber (fruits, vegetables, whole grains, legumes) 3. Be physically active or exercise including walking, jogging, swimming, yoga, etc. 4. For active constipation use a stool softener (docusate) or an osmotic laxative (like Miralax) each day, or as needed.   For your knee pain, you may take 500mg  Tylenol every 6-8 hours with meloxicam 7.5mg  every 24 hours for knee pain and inflammation.   For a consult of physical therapy, please contact [facility].  [phone] Please call Julieanne CottonJennifer Auman, Office Manager to set up an appointment

## 2017-08-11 NOTE — ED Provider Notes (Signed)
  MRN: [id number] DOB: [birth year]  Subjective:   Courtney Mcdaniel is a 27 y.o. female presenting for 4 day history of lower abdominal pain. Symptoms started with diarrhea on Friday which has improved with Pepto-Bismol. However, she has not had a bowel movement since Friday. Has been tolerating liquids, eating meals. Denies fever, nausea, vomiting, bloody stools, dysuria, hematuria. Also reports ~6 month history of intermittent left knee pain since helping someone move into an apartment. This particular episode is causing knee pain when she walks up the stairs. Denies trauma, redness, swelling. 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 Has not tried any medications for relief.   Courtney Mcdaniel has No Known Allergies.  Courtney Mcdaniel denies past medical and surgical history.   Objective:   Vitals: BP 106/70   Pulse 73   Temp 98.4 F (36.9 C) (Oral)   Resp 16   Ht 5\' 2"  (1.575 m)   Wt 112 lb (50.8 kg)   LMP 07/19/2017   SpO2 100%   BMI 20.49 kg/m   Physical Exam  Constitutional: She is oriented to person, place, and time. She appears well-developed and well-nourished.  Cardiovascular: Normal rate, regular rhythm and intact distal pulses. Exam reveals no gallop and no friction rub.  No murmur heard. Pulmonary/Chest: No respiratory distress. She has no wheezes. She has no rales.  Abdominal: Soft. Bowel sounds are normal. She exhibits no distension and no mass. There is tenderness (lower abdomen, LLQ).  No CVA tenderness.  Musculoskeletal: She exhibits no edema.       Left knee: She exhibits normal range of motion, no swelling, no effusion, no ecchymosis, no deformity, no erythema, normal alignment, normal patellar mobility and no bony tenderness. No tenderness found.  Neurological: She is alert and oriented to person, place, and time.  Skin: Skin is warm and dry. No rash noted. No erythema. No pallor.  Psychiatric: She has a normal mood and affect.   Assessment and Plan :   Constipation, unspecified  constipation type  Diarrhea, unspecified type  Lower abdominal pain  LLQ abdominal pain  Chronic pain of left knee   Patient refused any imaging due to cost burden with her insurance policy. Will start management for constipation. Use NSAID with APAP for her chronic knee pain. Recommended she look to set up physical therapy. Return-to-clinic precautions discussed, patient verbalized understanding.    Wallis BambergMani, Keely Drennan, [place]PA-C 08/11/17 1913

## 2017-08-19 DIAGNOSIS — K5900 Constipation, unspecified: Secondary | ICD-10-CM | POA: Insufficient documentation

## 2017-08-21 ENCOUNTER — Ambulatory Visit (HOSPITAL_COMMUNITY)
Admission: EM | Admit: 2017-08-21 | Discharge: 2017-08-21 | Disposition: A | Discharge status: Home / Self Care | Payer: BLUE CROSS/BLUE SHIELD | Attending: Internal Medicine | Admitting: Internal Medicine

## 2017-08-21 ENCOUNTER — Encounter (HOSPITAL_COMMUNITY): Payer: Self-pay | Admitting: Emergency Medicine

## 2017-08-21 ENCOUNTER — Other Ambulatory Visit: Payer: Self-pay

## 2017-08-21 DIAGNOSIS — R197 Diarrhea, unspecified: Secondary | ICD-10-CM

## 2017-08-21 DIAGNOSIS — R1032 Left lower quadrant pain: Secondary | ICD-10-CM | POA: Diagnosis not present

## 2017-08-21 MED ORDER — DICYCLOMINE HCL 20 MG PO TABS: 20.0000 mg | ORAL_TABLET | Freq: Two times a day (BID) | ORAL | 0 refills | Status: DC

## 2017-08-21 NOTE — ED Triage Notes (Signed)
Pt c/o generalized abdominal pain, with diarrhea started this morning. Pt was seen here for constipation and has been taking laxatives and now has diarrhea.

## 2017-08-21 NOTE — ED Provider Notes (Signed)
[facility]    CSN: [id number] Arrival date & time: 08/21/17  1158     History   Chief Complaint Chief Complaint  Patient presents with  . Abdominal Pain    HPI ARTESIA BERKEY is a 27 y.o. female.   27 year old female comes in for 1 day history of generalized abdominal pain with diarrhea.  Patient states she was seen here about a week ago for abdominal pain, was found to be constipated at that time, she has been taking Colace and MiraLAX every day, and stopped 2 days ago.  States that she had mild abdominal cramping during Colace use, but significantly worse today.  She states it could have been due to eating a salad  last night.  She denies nausea, vomiting.  Was able to eat this morning, which exacerbated the pain.  She states she had similar symptoms prior to being seen last week, where she had diarrhea after eating a salad, took Pepto-Bismol, then experienced constipation afterwards.  Denies fever, chills, night sweats.  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  Denies lethargy, shortness of breath, chest pain.      History reviewed. No pertinent past medical history.  There are no active problems to display for this patient.   History reviewed. No pertinent surgical history.  OB History    No data available       Home Medications    Prior to Admission medications   Medication Sig Start Date End Date Taking? Authorizing Provider  bismuth subsalicylate (PEPTO BISMOL) 262 MG/15ML suspension Take 30 mLs by mouth every 6 (six) hours as needed.    [provider]  butalbital-acetaminophen-caffeine (FIORICET, ESGIC) 50-325-40 MG tablet Take 1-2 tablets by mouth every 6 (six) hours as needed for headache. 11/04/16 11/04/17  Elvina Sidle, MD  diclofenac (VOLTAREN) 75 MG EC tablet Take 1 tablet (75 mg total) by mouth 2 (two) times daily.  07/16/17   Mardella Layman, MD  dicyclomine (BENTYL) 20 MG tablet Take 1 tablet (20 mg total) by mouth 2 (two) times daily. 08/21/17   Cathie Hoops, Cara Aguino V, PA-C  docusate sodium (COLACE) 50 MG capsule Take 1 capsule (50 mg total) by mouth 2 (two) times daily. 08/11/17   Wallis Bamberg, PA-C  hydrOXYzine (ATARAX/VISTARIL) 25 MG tablet Take 1 tablet (25 mg total) by mouth every 8 (eight) hours as needed for anxiety. 10/05/14   Fayrene Helper, PA-C  ipratropium (ATROVENT) 0.03 % nasal spray Place 2 sprays into both nostrils 2 (two) times daily. 11/04/16   Elvina Sidle, MD  meloxicam (MOBIC) 7.5 MG tablet Take 1 tablet (7.5 mg total) by mouth daily. 08/11/17   Wallis Bamberg, PA-C  Phenyleph-Doxylamine-DM-APAP (ALKA-SELTZER PLS NIGHT CLD/FLU PO) Take by mouth.    [provider]  polyethylene glycol (MIRALAX / GLYCOLAX) packet Take 17 g by mouth daily as needed for severe constipation. 08/11/17   Wallis Bamberg, PA-C    Family History Family History  Problem Relation Age of Onset  . Diabetes Father     Social History Social History   Tobacco Use  . Smoking status: Never Smoker  . Smokeless tobacco: Never Used  Substance Use Topics  . Alcohol use: No    Comment: 1 -2 glasses of wine daily  . Drug use: No     Allergies   Patient has no known allergies.   Review of Systems Review  of Systems  Reason unable to perform ROS: See HPI as above.     Physical Exam Triage Vital Signs ED Triage Vitals  Enc Vitals Group     BP 08/21/17 1234 108/67     Pulse Rate 08/21/17 1234 72     Resp 08/21/17 1234 18     Temp 08/21/17 1234 97.9 F (36.6 C)     Temp src --      SpO2 08/21/17 1234 97 %     Weight --      Height --      Head Circumference --      Peak Flow --      Pain Score 08/21/17 1236 4     Pain Loc --      Pain Edu? --      Excl. in GC? --    No data found.  Updated Vital Signs BP 108/67   Pulse 72   Temp 97.9 F (36.6 C)   Resp 18   LMP 08/14/2017   SpO2 97%   Physical Exam    Constitutional: She is oriented to person, place, and time. She appears well-developed and well-nourished. No distress.  HENT:  Head: Normocephalic and atraumatic.  Eyes: Conjunctivae are normal. Pupils are equal, round, and reactive to light.  Cardiovascular: Normal rate, regular rhythm and normal heart sounds. Exam reveals no gallop and no friction rub.  No murmur heard. Pulmonary/Chest: Effort normal and breath sounds normal. She has no wheezes. She has no rales.  Abdominal: Soft. Bowel sounds are normal. There is tenderness (LLQ pain). There is no rebound, no guarding and no CVA tenderness.  Neurological: She is alert and oriented to person, place, and time.  Skin: Skin is warm and dry.  Psychiatric: She has a normal mood and affect. Her behavior is normal. Judgment normal.    UC Treatments / Results  Labs (all labs ordered are listed, but only abnormal results are displayed) Labs Reviewed - No data to display  EKG  EKG Interpretation None       Radiology No results found.  Procedures Procedures (including critical care time)  Medications Ordered in UC Medications - No data to display   Initial Impression / Assessment and Plan / UC Course  I have reviewed the triage vital signs and the nursing notes.  Pertinent labs & imaging results that were available during my care of the patient were reviewed by me and considered in my medical decision making (see chart for details).    No alarming signs on exam.  Called [facility] poison control regarding dichloromethane exposure. No known cause of cramping/diarrheal symptoms with dichloromethane. Does state that possible carbon monoxide poisoning with long term use, though given chemical is under ventilator hood, likelihood is low.  Information passed on to patient.  Given patient has had alternating constipation and diarrhea with use of Pepto-Bismol, Colace, MiraLAX will have patient discontinue these medications.  Discussed  possible irritable bowel syndrome, irregular bowel movements because by medication use, stomach virus.  Patient can take Bentyl to help with stomach cramping.  Bland diet, advance as tolerated.  Push fluids.  Patient to follow-up with PCP/GI if symptoms continues.  Return precautions given.  Patient expresses understanding and agrees to plan.  Final Clinical Impressions(s) / UC Diagnoses   Final diagnoses:  Left lower quadrant pain  Diarrhea, unspecified type    ED Discharge Orders        Ordered    dicyclomine (BENTYL) 20 MG tablet  2 times daily     08/21/17 1355        Belinda FisherYu, Yexalen Deike V, [place]PA-C 08/21/17 1411

## 2017-08-21 NOTE — Discharge Instructions (Signed)
No alarming signs on exam.  As discussed, be due to something you ate, laxatives.  Stop Colace, MiraLAX, Pepto-Bismol.  Can take Bentyl to help with your symptoms.  Start with bland diet, slowly increase as tolerated.  Avoid spicy, fatty foods for now.  Follow-up with PCP/GI for further evaluation if symptoms do not resolve.  If experiencing worsening symptoms, blood in stool, fever, worsening abdominal pain, pain so severe you cannot walk or car rides make pain worse, go to the emergency department for further evaluation.

## 2017-08-26 ENCOUNTER — Encounter: Payer: Self-pay | Admitting: Gastroenterology

## 2017-09-01 ENCOUNTER — Inpatient Hospital Stay: Payer: BLUE CROSS/BLUE SHIELD

## 2017-09-03 ENCOUNTER — Encounter: Payer: Self-pay | Admitting: Gastroenterology

## 2017-09-03 ENCOUNTER — Other Ambulatory Visit (INDEPENDENT_AMBULATORY_CARE_PROVIDER_SITE_OTHER): Payer: BLUE CROSS/BLUE SHIELD

## 2017-09-03 ENCOUNTER — Ambulatory Visit (INDEPENDENT_AMBULATORY_CARE_PROVIDER_SITE_OTHER): Payer: BLUE CROSS/BLUE SHIELD | Admitting: Gastroenterology

## 2017-09-03 DIAGNOSIS — R1084 Generalized abdominal pain: Secondary | ICD-10-CM | POA: Diagnosis not present

## 2017-09-03 DIAGNOSIS — R634 Abnormal weight loss: Secondary | ICD-10-CM

## 2017-09-03 DIAGNOSIS — R197 Diarrhea, unspecified: Secondary | ICD-10-CM

## 2017-09-03 LAB — CBC WITH DIFFERENTIAL/PLATELET
BASOS ABS: 0.1 10*3/uL (ref 0.0–0.1)
Basophils Relative: 1.9 % (ref 0.0–3.0)
EOS ABS: 0.2 10*3/uL (ref 0.0–0.7)
Eosinophils Relative: 6.7 % — ABNORMAL HIGH (ref 0.0–5.0)
HCT: 41.4 % (ref 36.0–46.0)
Hemoglobin: 14 g/dL (ref 12.0–15.0)
LYMPHS ABS: 1.3 10*3/uL (ref 0.7–4.0)
Lymphocytes Relative: 34.8 % (ref 12.0–46.0)
MCHC: 33.8 g/dL (ref 30.0–36.0)
MCV: 91.3 fl (ref 78.0–100.0)
MONO ABS: 0.4 10*3/uL (ref 0.1–1.0)
Monocytes Relative: 10 % (ref 3.0–12.0)
NEUTROS PCT: 46.6 % (ref 43.0–77.0)
Neutro Abs: 1.7 10*3/uL (ref 1.4–7.7)
Platelets: 200 10*3/uL (ref 150.0–400.0)
RBC: 4.53 Mil/uL (ref 3.87–5.11)
RDW: 13.7 % (ref 11.5–15.5)
WBC: 3.7 10*3/uL — ABNORMAL LOW (ref 4.0–10.5)

## 2017-09-03 LAB — IGA: IgA: 146 mg/dL (ref 68–378)

## 2017-09-03 LAB — COMPREHENSIVE METABOLIC PANEL
ALT: 33 U/L (ref 0–35)
AST: 21 U/L (ref 0–37)
Albumin: 4.5 g/dL (ref 3.5–5.2)
Alkaline Phosphatase: 28 U/L — ABNORMAL LOW (ref 39–117)
BILIRUBIN TOTAL: 1.4 mg/dL — AB (ref 0.2–1.2)
BUN: 9 mg/dL (ref 6–23)
CO2: 25 meq/L (ref 19–32)
CREATININE: 0.85 mg/dL (ref 0.40–1.20)
Calcium: 9.3 mg/dL (ref 8.4–10.5)
Chloride: 107 mEq/L (ref 96–112)
GFR: 102.14 mL/min (ref >60.00)
GLUCOSE: 82 mg/dL (ref 70–99)
Potassium: 4.1 mEq/L (ref 3.5–5.1)
Sodium: 139 mEq/L (ref 135–145)
Total Protein: 7.4 g/dL (ref 6.0–8.3)

## 2017-09-03 LAB — HIGH SENSITIVITY CRP: CRP, High Sensitivity: 0.21 mg/L (ref 0.000–5.000)

## 2017-09-03 LAB — TSH: TSH: 2.75 u[IU]/mL (ref 0.35–4.50)

## 2017-09-03 LAB — SEDIMENTATION RATE: SED RATE: 3 mm/h (ref 0–20)

## 2017-09-03 NOTE — Progress Notes (Addendum)
09/03/2017 Courtney Mcdaniel [id number] 01-17-1991   HISTORY OF PRESENT ILLNESS:  This is a 27 year old female who is new to our practice and here for ER follow-up, referred by Linward HeadlandAmy Yu, PA-C, from the ED.  Does not have a PCP, but was referred to one of those as well.  Anyway, since the beginning of this month she has been experiencing cramping abdominal pain and intermittent diarrhea.  Also reports 5 pound weight loss in this time.  Says that she has only been eating yogurt, peanut butter crackers, and soup because she is afraid that the pain will get worse.  Pain starts on the right side but then seems to go all over and even feels like it is going into her heart at times.  No nausea and vomiting.  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  ER called poinson control about dichloromethane and they said no correlation with symptoms.  Now she is asking about isopropyl ETOH that she heats on a burner, does wear a mask.  We called poison control and spoke with an RN who stated that there should be no correlation with isopropyl ETOH, says that she would have to be ingesting it.  Has otherwise declined imaging and labs until now.  Tells me that she has been working at her job since July and has not had any of these symptoms in the past.  Says that once she is out of work for a week the symptoms resolve, but as soon as she goes back to work then they return again.  Was given Bentyl and she tried it; says that it did help some but not completely.  Says that she had diarrhea on Saturday and has been out of work all week.  Now no BM since Saturday.  No issues with moving her bowels previously.  Reports black stools but has been using a lot of Pepto-Bismol.    No past medical history on file. No past surgical history on file.  reports that  has never smoked. she has never used smokeless tobacco. She reports that she does not drink alcohol or use  drugs. family history includes Diabetes in her father. No Known Allergies    Outpatient Encounter Medications as of 09/03/2017  Medication Sig  . bismuth subsalicylate (PEPTO BISMOL) 262 MG/15ML suspension Take 30 mLs by mouth every 6 (six) hours as needed.  . butalbital-acetaminophen-caffeine (FIORICET, ESGIC) 50-325-40 MG tablet Take 1-2 tablets by mouth every 6 (six) hours as needed for headache.  . diclofenac (VOLTAREN) 75 MG EC tablet Take 1 tablet (75 mg total) by mouth 2 (two) times daily.  Marland Kitchen. dicyclomine (BENTYL) 20 MG tablet Take 1 tablet (20 mg total) by mouth 2 (two) times daily.  Marland Kitchen. docusate sodium (COLACE) 50 MG capsule Take 1 capsule (50 mg total) by mouth 2 (two) times daily.  . hydrOXYzine (ATARAX/VISTARIL) 25 MG tablet Take 1 tablet (25 mg total) by mouth every 8 (eight) hours as needed for anxiety.  Marland Kitchen. ipratropium (ATROVENT) 0.03 % nasal spray Place 2 sprays into both nostrils 2 (two) times daily.  . meloxicam (MOBIC) 7.5 MG tablet Take 1 tablet (7.5 mg total) by mouth daily.  Marland Kitchen. Phenyleph-Doxylamine-DM-APAP (ALKA-SELTZER PLS NIGHT CLD/FLU PO) Take by mouth.  . polyethylene glycol (MIRALAX / GLYCOLAX) packet Take 17 g by mouth daily as needed for severe constipation.   No facility-administered encounter medications on file as  of 09/03/2017.      REVIEW OF SYSTEMS  : All other systems reviewed and negative except where noted in the History of Present Illness.   PHYSICAL EXAM: BP 100/60   Pulse 64   Ht 5\' 2"  (1.575 m)   Wt 104 lb (47.2 kg)   LMP 08/14/2017   BMI 19.02 kg/m  General: Well developed black female in no acute distress Head: Normocephalic and atraumatic Eyes:  Sclerae anicteric, conjunctiva pink. Ears: Normal auditory acuity Lungs: Clear throughout to auscultation; no increased WOB. Heart: Regular rate and rhythm; no M/R/G. Abdomen: Soft, non-distended.  BS present.  Mild diffuse TTP. Musculoskeletal: Symmetrical with no gross deformities  Skin: No  lesions on visible extremities Extremities: No edema  Neurological: Alert oriented x 4, grossly non-focal Psychological:  Alert and cooperative. Normal mood and affect  ASSESSMENT AND PLAN: *26 year old female with close to a one month history of generalized abdominal pain and intermittent diarrhea.  Also reports 5 pound weight loss in this time.  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  ER called about dichloromethane and they said no correlation with symptoms.  Now she is asking about isopropyl ETOH that she heats on a burner, does wear a mask.  We called poison control and spoke with an RN who stated that there should be no correlation with isopropyl ETOH, says that she would have to be ingesting it.  Has otherwise declined imaging and labs until now.  Will order CBC, CMP, TSH, sed rate, CRP, and celiac labs.  Will order CT scan of the abdomen and pelvis with contrast.  Continue Bentyl prn.  If no BM today then needs to take some Miralax as last BM was 1/26.   Agree with [name] management.  History provided associates sxs with work - ? Something else in environment at work - physical or otherwise that is contributing to sxs. Await structural evaluaion  Iva Boop, MD, Clementeen Graham

## 2017-09-03 NOTE — Patient Instructions (Signed)
We have spoken to [place], RN at [place] control they state there is no correlation between your stomach pain and the chemicals used on your job.   Your physician has requested that you go to the basement for lab work before leaving today.  You have been scheduled for a CT scan of the abdomen and pelvis at [facility] ([address]---this is in the same building as [facility]).   You are scheduled on 09-12-17 at 2:30 pm. You should arrive 15 minutes prior to your appointment time for registration. Please follow the written instructions below on the day of your exam:  WARNING: IF YOU ARE ALLERGIC TO IODINE/X-RAY DYE, PLEASE NOTIFY RADIOLOGY IMMEDIATELY AT [phone]! YOU WILL BE GIVEN A 13 HOUR PREMEDICATION PREP.  1) Do not eat or drink anything after 10:30 am (4 hours prior to your test) 2) You have been given 2 bottles of oral contrast to drink. The solution may taste               better if refrigerated, but do NOT add ice or any other liquid to this solution. Shake             well before drinking.    Drink 1 bottle of contrast @ 12:30 pm (2 hours prior to your exam)  Drink 1 bottle of contrast @ 1:30 pm (1 hour prior to your exam)  You may take any medications as prescribed with a small amount of water except for the following: Metformin, Glucophage, Glucovance, Avandamet, Riomet, Fortamet, Actoplus Met, Janumet, Glumetza or Metaglip. The above medications must be held the day of the exam AND 48 hours after the exam.  The purpose of you drinking the oral contrast is to aid in the visualization of your intestinal tract. The contrast solution may cause some diarrhea. Before your exam is started, you will be given a small amount of fluid to drink. Depending on your individual set of symptoms, you may also receive an intravenous injection of x-ray contrast/dye. Plan on being at [facility] for 30 minutes or longer, depending on the type of exam you are having  performed.  This test typically takes 30-45 minutes to complete.  If you have any questions regarding your exam or if you need to reschedule, you may call the CT department at [phone] between the hours of 8:00 am and 5:00 pm, Monday-Friday.  ________________________________________________________________________

## 2017-09-04 ENCOUNTER — Telehealth: Payer: Self-pay | Admitting: Gastroenterology

## 2017-09-04 LAB — TISSUE TRANSGLUTAMINASE, IGA: (TTG) AB, IGA: 1 U/mL

## 2017-09-04 NOTE — Telephone Encounter (Signed)
Zehr, Princella PellegriniJessica D, PA-C  Brandt Loosenaylor, Nestor Wieneke L, RN        Please let her know that her labs look fine. Celiac disease negative. Await results of CT scan.   Thank you,   Jess    The pt has been advised of the labs

## 2017-09-12 ENCOUNTER — Inpatient Hospital Stay: Admission: RE | Admit: 2017-09-12 | Payer: BLUE CROSS/BLUE SHIELD | Source: Ambulatory Visit

## 2017-10-31 ENCOUNTER — Encounter (HOSPITAL_COMMUNITY): Payer: Self-pay | Admitting: Emergency Medicine

## 2017-10-31 ENCOUNTER — Ambulatory Visit (HOSPITAL_COMMUNITY)
Admission: EM | Admit: 2017-10-31 | Discharge: 2017-10-31 | Disposition: A | Discharge status: Home / Self Care | Payer: BLUE CROSS/BLUE SHIELD | Attending: Family Medicine | Admitting: Family Medicine

## 2017-10-31 DIAGNOSIS — R42 Dizziness and giddiness: Secondary | ICD-10-CM

## 2017-10-31 LAB — POCT URINALYSIS DIP (DEVICE)
Bilirubin Urine: NEGATIVE
Glucose, UA: NEGATIVE mg/dL
KETONES UR: 40 mg/dL — AB
LEUKOCYTES UA: NEGATIVE
Nitrite: NEGATIVE
Protein, ur: NEGATIVE mg/dL
SPECIFIC GRAVITY, URINE: 1.025 (ref 1.005–1.030)
Urobilinogen, UA: 0.2 mg/dL (ref 0.0–1.0)
pH: 5.5 (ref 5.0–8.0)

## 2017-10-31 LAB — POCT I-STAT, CHEM 8
BUN: 20 mg/dL (ref 6–20)
CALCIUM ION: 1.14 mmol/L — AB (ref 1.15–1.40)
CREATININE: 0.8 mg/dL (ref 0.44–1.00)
Chloride: 105 mmol/L (ref 101–111)
GLUCOSE: 75 mg/dL (ref 65–99)
HCT: 43 % (ref 36.0–46.0)
HEMOGLOBIN: 14.6 g/dL (ref 12.0–15.0)
POTASSIUM: 4.4 mmol/L (ref 3.5–5.1)
Sodium: 140 mmol/L (ref 135–145)
TCO2: 25 mmol/L (ref 22–32)

## 2017-10-31 NOTE — ED Provider Notes (Signed)
[facility]    CSN: [id number] Arrival date & time: 10/31/17  1219     History   Chief Complaint Chief Complaint  Patient presents with  . Dizziness    HPI Courtney Mcdaniel is a 27 y.o. female.   Otherwise healthy female presents with feeling of "weakness" in her hands when making a fist. She also felt as though she was going to pass out yesterday morning. She states that when she sits up straight her head has a "weird feeling". She denies frank headaches or actual dizziness. She denies URI symptoms.  No fevers. She went to the gym yesterday and felt fine. See full Remainder of ROS     History reviewed. No pertinent past medical history.  Patient Active Problem List   Diagnosis Date Noted  . Generalized abdominal pain 09/03/2017  . Diarrhea 09/03/2017  . Abnormal weight loss 09/03/2017    History reviewed. No pertinent surgical history.  OB History   None      Home Medications    Prior to Admission medications   Medication Sig Start Date End Date Taking? Authorizing Provider  bismuth subsalicylate (PEPTO BISMOL) 262 MG/15ML suspension Take 30 mLs by mouth every 6 (six) hours as needed.    [provider]  butalbital-acetaminophen-caffeine (FIORICET, ESGIC) 50-325-40 MG tablet Take 1-2 tablets by mouth every 6 (six) hours as needed for headache. 11/04/16 11/04/17  Elvina SidleLauenstein, Kurt, MD  diclofenac (VOLTAREN) 75 MG EC tablet Take 1 tablet (75 mg total) by mouth 2 (two) times daily. 07/16/17   Mardella LaymanHagler, Brian, MD  dicyclomine (BENTYL) 20 MG tablet Take 1 tablet (20 mg total) by mouth 2 (two) times daily. 08/21/17   Cathie HoopsYu, Amy V, PA-C  docusate sodium (COLACE) 50 MG capsule Take 1 capsule (50 mg total) by mouth 2 (two) times daily. 08/11/17   Wallis BambergMani, Mario, PA-C  hydrOXYzine (ATARAX/VISTARIL) 25 MG tablet Take 1 tablet (25 mg total) by mouth every 8 (eight) hours as needed for anxiety. 10/05/14   Fayrene Helperran, Bowie, PA-C  ipratropium (ATROVENT) 0.03 % nasal spray Place 2  sprays into both nostrils 2 (two) times daily. 11/04/16   Elvina SidleLauenstein, Kurt, MD  meloxicam (MOBIC) 7.5 MG tablet Take 1 tablet (7.5 mg total) by mouth daily. 08/11/17   Wallis BambergMani, Mario, PA-C  Phenyleph-Doxylamine-DM-APAP (ALKA-SELTZER PLS NIGHT CLD/FLU PO) Take by mouth.    [provider]  polyethylene glycol (MIRALAX / GLYCOLAX) packet Take 17 g by mouth daily as needed for severe constipation. 08/11/17   Wallis BambergMani, Mario, PA-C    Family History Family History  Problem Relation Age of Onset  . Diabetes Father     Social History Social History   Tobacco Use  . Smoking status: Never Smoker  . Smokeless tobacco: Never Used  Substance Use Topics  . Alcohol use: No    Comment: 1 -2 glasses of wine daily  . Drug use: No     Allergies   Patient has no known allergies.   Review of Systems Review of Systems  Constitutional: Negative for chills, fatigue and fever.  HENT: Negative.  Negative for ear pain and sinus pain.   Eyes: Negative.   Respiratory: Negative for cough and shortness of breath.   Cardiovascular: Negative.   Gastrointestinal: Negative.   Genitourinary: Negative.  Negative for pelvic pain.  Skin: Negative.   Allergic/Immunologic: Negative.   Neurological: Positive for light-headedness.  Psychiatric/Behavioral: Negative.      Physical Exam Triage Vital Signs ED Triage Vitals  Enc Vitals  Group     BP 10/31/17 1255 118/85     Pulse Rate 10/31/17 1255 79     Resp 10/31/17 1255 18     Temp 10/31/17 1255 98.5 F (36.9 C)     Temp src --      SpO2 10/31/17 1255 100 %     Weight --      Height --      Head Circumference --      Peak Flow --      Pain Score 10/31/17 1257 0     Pain Loc --      Pain Edu? --      Excl. in GC? --    No data found.  Updated Vital Signs BP 118/85   Pulse 79   Temp 98.5 F (36.9 C)   Resp 18   LMP 10/12/2017   SpO2 100%   Visual Acuity Right Eye Distance:   Left Eye Distance:   Bilateral Distance:    Right Eye Near:     Left Eye Near:    Bilateral Near:     Physical Exam  Constitutional: She is oriented to person, place, and time. She appears well-developed and well-nourished. No distress.  HENT:  Head: Normocephalic and atraumatic.  Right Ear: External ear normal.  Left Ear: External ear normal.  Eyes: Pupils are equal, round, and reactive to light. EOM are normal. Right eye exhibits no discharge. Left eye exhibits no discharge.  Neck: Normal range of motion. Neck supple.  Cardiovascular: Normal rate and regular rhythm.  Pulmonary/Chest: Effort normal and breath sounds normal.  Neurological: She is alert and oriented to person, place, and time.  Skin: Skin is warm and dry. She is not diaphoretic.  Psychiatric: Her behavior is normal.  Nursing note and vitals reviewed.    UC Treatments / Results  Labs (all labs ordered are listed, but only abnormal results are displayed) Labs Reviewed - No data to display  EKG None Radiology No results found.  Procedures Procedures (including critical care time)  Medications Ordered in UC Medications - No data to display   Initial Impression / Assessment and Plan / UC Course  I have reviewed the triage vital signs and the nursing notes.  Pertinent labs & imaging results that were available during my care of the patient were reviewed by me and considered in my medical decision making (see chart for details).   Etiology for her 2 day history of feelings of hand weakness and one episode of light headiness is unclear. Exam is normal, screening basic serologies normal and no alarming signs here in the Urgent care. Recommend fluids, rest and f/u with PCP if her symptoms continue. If she has any further worrisome or emergent symptoms then f/u in the ED    Final Clinical Impressions(s) / UC Diagnoses   Final diagnoses:  None    ED Discharge Orders    None       Controlled Substance Prescriptions [place] Controlled Substance Registry consulted? Not  Applicable   [name], PA-C 10/31/17 1410

## 2017-10-31 NOTE — Discharge Instructions (Addendum)
It is unclear why you are feeling the way that you do. Sometimes viruses can do this. For now be sure to stay hydrated and well. If your symptoms worsen and become urgent go to the ED. If they are not improving then f/u with a PCP as we discussed.

## 2017-10-31 NOTE — ED Triage Notes (Signed)
Pt states since Wednesday, she took a nap, and woke up and feels like she is about to pass out. Pt states she cant ball her hands up. Pt states "I keep having to put my head down to feel normal".

## 2017-11-24 ENCOUNTER — Ambulatory Visit (INDEPENDENT_AMBULATORY_CARE_PROVIDER_SITE_OTHER): Payer: Self-pay

## 2017-11-24 ENCOUNTER — Encounter (HOSPITAL_COMMUNITY): Payer: Self-pay | Admitting: Family Medicine

## 2017-11-24 ENCOUNTER — Ambulatory Visit (HOSPITAL_COMMUNITY)
Admission: EM | Admit: 2017-11-24 | Discharge: 2017-11-24 | Disposition: A | Discharge status: Home / Self Care | Payer: Self-pay | Attending: Family Medicine | Admitting: Family Medicine

## 2017-11-24 DIAGNOSIS — Z79899 Other long term (current) drug therapy: Secondary | ICD-10-CM | POA: Insufficient documentation

## 2017-11-24 DIAGNOSIS — R002 Palpitations: Secondary | ICD-10-CM

## 2017-11-24 DIAGNOSIS — R0789 Other chest pain: Secondary | ICD-10-CM | POA: Insufficient documentation

## 2017-11-24 LAB — POCT I-STAT, CHEM 8
BUN: 13 mg/dL (ref 6–20)
Calcium, Ion: 1.24 mmol/L (ref 1.15–1.40)
Chloride: 104 mmol/L (ref 101–111)
Creatinine, Ser: 1 mg/dL (ref 0.44–1.00)
Glucose, Bld: 76 mg/dL (ref 65–99)
HEMATOCRIT: 41 % (ref 36.0–46.0)
HEMOGLOBIN: 13.9 g/dL (ref 12.0–15.0)
Potassium: 4 mmol/L (ref 3.5–5.1)
SODIUM: 138 mmol/L (ref 135–145)
TCO2: 22 mmol/L (ref 22–32)

## 2017-11-24 LAB — TSH: TSH: 1.47 u[IU]/mL (ref 0.350–4.500)

## 2017-11-24 MED ORDER — HYDROXYZINE HCL 25 MG PO TABS: 25.0000 mg | ORAL_TABLET | Freq: Three times a day (TID) | ORAL | 0 refills | Status: DC | PRN

## 2017-11-24 NOTE — ED Notes (Signed)
Patient transported to X-ray 

## 2017-11-24 NOTE — Discharge Instructions (Signed)
Avoid caffeine.  Light activity is fine. Your ekg and labs today are reassuring. May try hydroxyzine at onset of symptoms as some anxiety may be contributing to symptoms.  Please establish with a primary care provider and/or follow up with cardiology if symptoms persist. If develop increased pain, shortness of breath, nausea, vomiting or worsening of symptoms please return or go to Er.

## 2017-11-24 NOTE — ED Triage Notes (Signed)
Pt here for intermittent palpitations since last night. Reports chest tightness.

## 2017-11-24 NOTE — ED Provider Notes (Addendum)
[facility]    CSN: [id number] Arrival date & time: 11/24/17  1621     History   Chief Complaint Chief Complaint  Patient presents with  . Palpitations    HPI Courtney Mcdaniel is a 26 y.o. female.   [name] presents with complaints of her heart pounding and feeling left sided chest tightness which started last night while watching tv. It improved for a period of time but returned over night and woke her a few times while sleeping. Has been coming and going today. Denies any previous similar. Denies  Shortness of breath . Does not smoke. No recent travel, hospitalization, caffeine, alcohol, drug use, use of birth control. She is not sexually active. Denies cough, congestion, dizziness. Denies leg pain or swelling. Denies any increased stress. Has not taken any medications for symptoms. No cardiac history or otherwise contributing medical history. States her brother was diagnosed with an "enlarged heart" in his 78's, without any history of MI. Grandmother has had heart catheterization. Per chart review patient was seen in 2016 and diagnosed as panic attack with chief complaint from patient of palpitations as well. Does not have a PCP.    ROS per HPI.      History reviewed. No pertinent past medical history.  Patient Active Problem List   Diagnosis Date Noted  . Generalized abdominal pain 09/03/2017  . Diarrhea 09/03/2017  . Abnormal weight loss 09/03/2017    History reviewed. No pertinent surgical history.  OB History   None      Home Medications    Prior to Admission medications   Medication Sig Start Date End Date Taking? Authorizing Provider  hydrOXYzine (ATARAX/VISTARIL) 25 MG tablet Take 1 tablet (25 mg total) by mouth every 8 (eight) hours as needed for anxiety. 11/24/17   Linus Mako B, NP  ipratropium (ATROVENT) 0.03 % nasal spray Place 2 sprays into both nostrils 2 (two) times daily. 11/04/16   Elvina Sidle, MD    Family History Family  History  Problem Relation Age of Onset  . Diabetes Father     Social History Social History   Tobacco Use  . Smoking status: Never Smoker  . Smokeless tobacco: Never Used  Substance Use Topics  . Alcohol use: No    Comment: 1 -2 glasses of wine daily  . Drug use: No     Allergies   Patient has no known allergies.   Review of Systems Review of Systems   Physical Exam Triage Vital Signs ED Triage Vitals  Enc Vitals Group     BP 11/24/17 1656 103/78     Pulse Rate 11/24/17 1656 71     Resp 11/24/17 1656 18     Temp 11/24/17 1656 98.4 F (36.9 C)     Temp src --      SpO2 11/24/17 1656 97 %     Weight --      Height --      Head Circumference --      Peak Flow --      Pain Score 11/24/17 1655 0     Pain Loc --      Pain Edu? --      Excl. in GC? --    No data found.  Updated Vital Signs BP 103/78   Pulse 71   Temp 98.4 F (36.9 C)   Resp 18   LMP 10/12/2017   SpO2 97%    Physical Exam  Constitutional: She is oriented to person, place,  and time. She appears well-developed and well-nourished. No distress.  Cardiovascular: Normal rate, regular rhythm, normal heart sounds and normal pulses.  Occasional extrasystoles are present.  Patient sitting with left shoulder extended posteriorly as patient states stretching out feels better.   Pulmonary/Chest: Effort normal and breath sounds normal.  Neurological: She is alert and oriented to person, place, and time.  Skin: Skin is warm and dry.   EKG sinus Sinus arrhythmia, no PVC's noted, rate 66.   UC Treatments / Results  Labs (all labs ordered are listed, but only abnormal results are displayed) Labs Reviewed  TSH  POCT I-STAT, CHEM 8    EKG None Radiology Dg Chest 2 View  Result Date: 11/24/2017 CLINICAL DATA:  Heart palpitations, and upper chest tightness. EXAM: CHEST - 2 VIEW COMPARISON:  None. FINDINGS: The heart size and mediastinal contours are within normal limits. Both lungs are clear. The  visualized skeletal structures are unremarkable. IMPRESSION: No active cardiopulmonary disease. Electronically Signed   By: Elsie StainJohn T Curnes M.D.   On: 11/24/2017 17:52    Procedures Procedures (including critical care time)  Medications Ordered in UC Medications - No data to display   Initial Impression / Assessment and Plan / UC Course  I have reviewed the triage vital signs and the nursing notes.  Pertinent labs & imaging results that were available during my care of the patient were reviewed by me and considered in my medical decision making (see chart for details).     ekg reassuring. Chem 8 without acute findings. Non toxic in appearance. Chest xray without acute findings. Without contributing medical history. tsh pending, was normal in January 2019. Will provide hydroxyzine again to see if this is beneficial at time of onset of symptoms as anxiety may be contributing. To follow up with cardiology and/or establish with PCP for persistent symptoms. Return precautions provided. Patient verbalized understanding and agreeable to plan.    Final Clinical Impressions(s) / UC Diagnoses   Final diagnoses:  Palpitations    ED Discharge Orders        Ordered    hydrOXYzine (ATARAX/VISTARIL) 25 MG tablet  Every 8 hours PRN     11/24/17 1748       Controlled Substance Prescriptions [place] Controlled Substance Registry consulted? Not Applicable     Georgetta HaberBurky, Pauletta Pickney B, NP 11/24/17 1756

## 2017-11-26 ENCOUNTER — Telehealth (HOSPITAL_COMMUNITY): Payer: Self-pay

## 2017-11-26 NOTE — Telephone Encounter (Signed)
Test results are normal. No answer. MyChart message sent.

## 2017-12-31 ENCOUNTER — Encounter (HOSPITAL_COMMUNITY): Payer: Self-pay | Admitting: Emergency Medicine

## 2017-12-31 ENCOUNTER — Ambulatory Visit (HOSPITAL_COMMUNITY)
Admission: EM | Admit: 2017-12-31 | Discharge: 2017-12-31 | Disposition: A | Discharge status: Home / Self Care | Payer: Self-pay | Attending: Family Medicine | Admitting: Family Medicine

## 2017-12-31 ENCOUNTER — Other Ambulatory Visit: Payer: Self-pay

## 2017-12-31 DIAGNOSIS — H6981 Other specified disorders of Eustachian tube, right ear: Secondary | ICD-10-CM

## 2017-12-31 MED ORDER — PREDNISONE 10 MG (21) PO TBPK: ORAL_TABLET | Freq: Every day | ORAL | 0 refills | Status: DC

## 2017-12-31 NOTE — Discharge Instructions (Signed)
You may try over the counter Claritin-D or Zyrtec-D.

## 2017-12-31 NOTE — ED Triage Notes (Signed)
Onset Friday of bilateral ear pain and "thumping"  "heartbeat" noise in ears

## 2018-01-28 NOTE — ED Provider Notes (Signed)
[facility]   [id number] 12/31/17 Arrival Time: 1044  ASSESSMENT & PLAN:  1. ETD (Eustachian tube dysfunction), right    Trial of: Meds ordered this encounter  Medications  . predniSONE (STERAPRED UNI-PAK 21 TAB) 10 MG (21) TBPK tablet    Sig: Take by mouth daily. Take as directed.    Dispense:  21 tablet    Refill:  0   OTC symptom care as needed. May f/u with PCP or here as needed.  Reviewed expectations re: course of current medical issues. Questions answered. Outlined signs and symptoms indicating need for more acute intervention. Patient verbalized understanding. After Visit Summary given.   SUBJECTIVE: History from: patient.  Courtney Mcdaniel is a 27 y.o. female who presents with complaint of bilateral otalgia without drainage. Onset gradual, approximately a few days ago. "Feels like a heartbeat in my ears." Recent cold symptoms: congestion. Fever: no. Overall normal PO intake without n/v. Sick contacts: no. No specific aggravating or alleviating factors reported. OTC treatment: none.  Social History   Tobacco Use  Smoking Status Never Smoker  Smokeless Tobacco Never Used    ROS: As per HPI.   OBJECTIVE:  Vitals:   12/31/17 1148  BP: 106/70  Pulse: 78  Resp: 16  Temp: 98.7 F (37.1 C)  TempSrc: Oral  SpO2: 100%     General appearance: alert; appears fatigued Ear Canal: normal TM: bilateral: normal Neck: supple without LAD Lungs: unlabored respirations, symmetrical air entry; cough: absent; no respiratory distress Skin: warm and dry Psychological: alert and cooperative; normal mood and affect  No Known Allergies  History reviewed. No pertinent past medical history. Family History  Problem Relation Age of Onset  . Diabetes Father    Social History   Socioeconomic History  . Marital status: Single    Spouse name: Not on file  . Number of children: Not on file  . Years of education: Not on file  . Highest education level: Not on  file  Occupational History    Employer: [employer]  Social Needs  . Financial resource strain: Not on file  . Food insecurity:    Worry: Not on file    Inability: Not on file  . Transportation needs:    Medical: Not on file    Non-medical: Not on file  Tobacco Use  . Smoking status: Never Smoker  . Smokeless tobacco: Never Used  Substance and Sexual Activity  . Alcohol use: No    Comment: 1 -2 glasses of wine daily  . Drug use: No  . Sexual activity: Not on file  Lifestyle  . Physical activity:    Days per week: Not on file    Minutes per session: Not on file  . Stress: Not on file  Relationships  . Social connections:    Talks on phone: Not on file    Gets together: Not on file    Attends religious service: Not on file    Active member of club or organization: Not on file    Attends meetings of clubs or organizations: Not on file    Relationship status: Not on file  . Intimate partner violence:    Fear of current or ex partner: Not on file    Emotionally abused: Not on file    Physically abused: Not on file    Forced sexual activity: Not on file  Other Topics Concern  . Not on file  Social History Narrative  . Not on file  Mardella Layman, MD 01/29/18 [phone]

## 2019-01-09 IMAGING — DX DG CHEST 2V
2 series · 2 of 2 positions shown · non-contrast
Comparison: None.

CLINICAL DATA: Heart palpitations, and upper chest tightness.

EXAM:
CHEST - 2 VIEW

[chest pa]
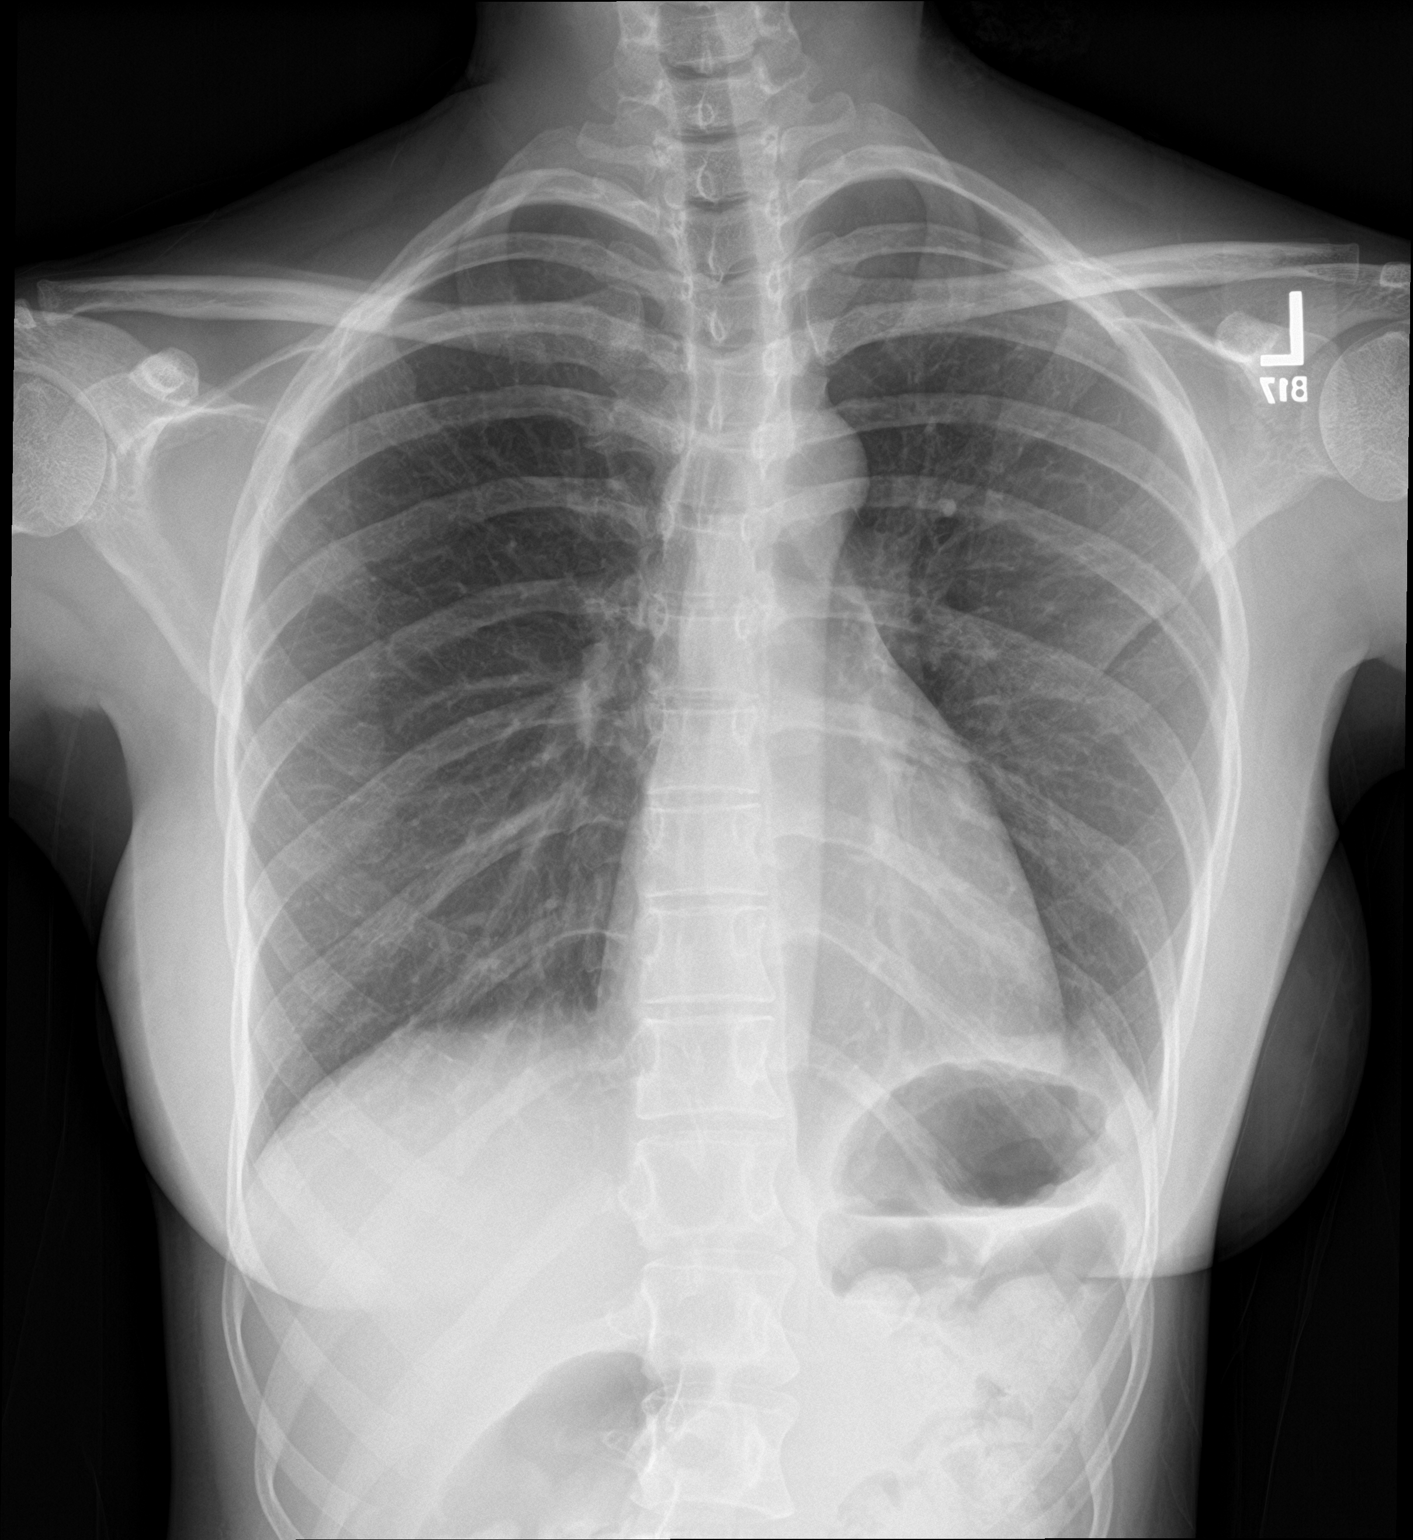

[chest lat]
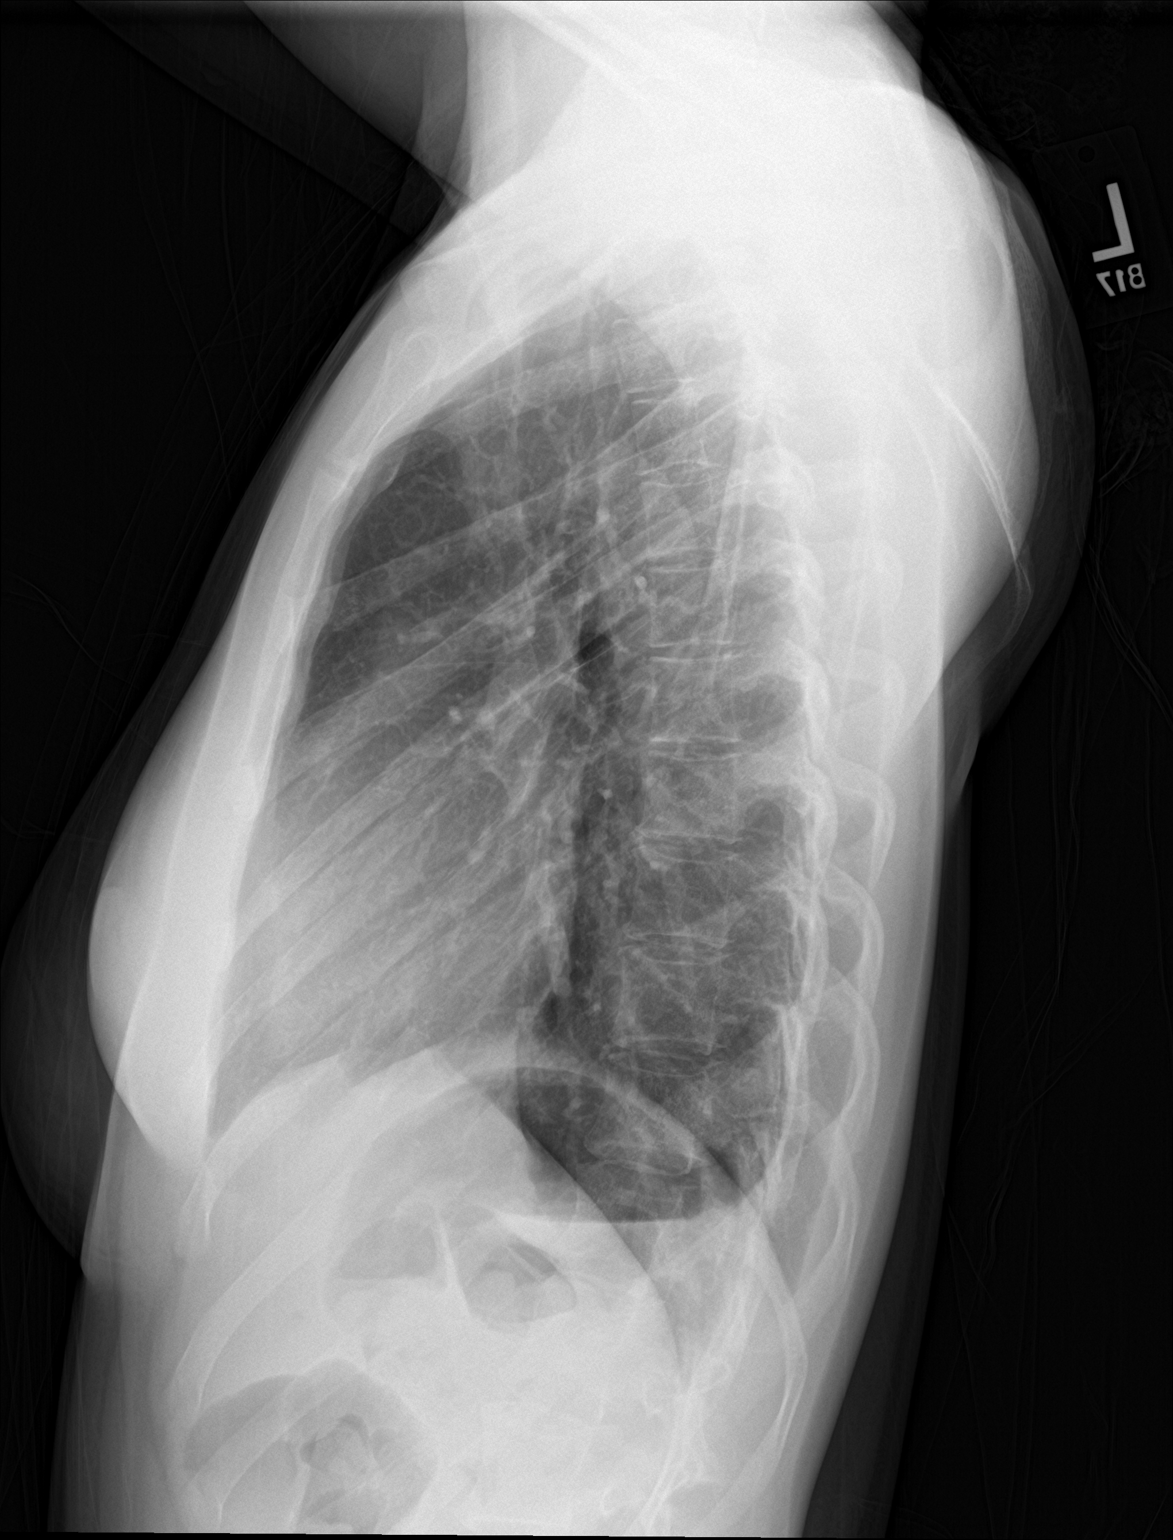

[2 of 2 positions shown; findings below may reference images not displayed]

FINDINGS: The heart size and mediastinal contours are within normal limits.
Both lungs are clear. The visualized skeletal structures are
unremarkable.
IMPRESSION: No active cardiopulmonary disease.

## 2020-02-01 ENCOUNTER — Encounter: Payer: Self-pay | Admitting: Nurse Practitioner

## 2020-02-01 ENCOUNTER — Other Ambulatory Visit: Payer: Self-pay

## 2020-02-04 ENCOUNTER — Encounter: Payer: Self-pay | Admitting: Nurse Practitioner

## 2020-02-04 ENCOUNTER — Other Ambulatory Visit: Payer: Self-pay

## 2020-02-04 ENCOUNTER — Other Ambulatory Visit: Payer: Self-pay | Admitting: Nurse Practitioner

## 2020-02-04 ENCOUNTER — Ambulatory Visit: Payer: Managed Care, Other (non HMO) | Admitting: Nurse Practitioner

## 2020-02-04 VITALS — BP 112/72 | HR 82 | Temp 97.6°F | Ht 62.0 in | Wt 97.0 lb

## 2020-02-04 DIAGNOSIS — R300 Dysuria: Secondary | ICD-10-CM | POA: Insufficient documentation

## 2020-02-04 DIAGNOSIS — Z113 Encounter for screening for infections with a predominantly sexual mode of transmission: Secondary | ICD-10-CM

## 2020-02-04 DIAGNOSIS — Z0000 Encounter for general adult medical examination without abnormal findings: Secondary | ICD-10-CM | POA: Insufficient documentation

## 2020-02-04 LAB — POCT URINALYSIS DIPSTICK
Bilirubin, UA: NEGATIVE
Blood, UA: NEGATIVE
Glucose, UA: NEGATIVE
Ketones, UA: NEGATIVE
Nitrite, UA: NEGATIVE
Protein, UA: NEGATIVE
Spec Grav, UA: 1.025 (ref 1.010–1.025)
Urobilinogen, UA: 0.2 E.U./dL
pH, UA: 7 (ref 5.0–8.0)

## 2020-02-04 LAB — URINALYSIS, MICROSCOPIC ONLY: RBC / HPF: NONE SEEN (ref 0–?)

## 2020-02-04 NOTE — Patient Instructions (Addendum)
We have sent off your urine and will call you when results are available.   I will send off urine screening and blood for sexually transmitted disease screen.   Please schedule a pap test and gyn exam with me or with your [facility] family physician.     Dysuria Dysuria is pain or discomfort while urinating. The pain or discomfort may be felt in the part of your body that drains urine from the bladder (urethra) or in the surrounding tissue of the genitals. The pain may also be felt in the groin area, lower abdomen, or lower back. You may have to urinate frequently or have the sudden feeling that you have to urinate (urgency). Dysuria can affect both men and women, but it is more common in women. Dysuria can be caused by many different things, including:  Urinary tract infection.  Kidney stones or bladder stones.  Certain sexually transmitted infections (STIs), such as chlamydia.  Dehydration.  Inflammation of the tissues of the vagina.  Use of certain medicines.  Use of certain soaps or scented products that cause irritation. Follow these instructions at home: General instructions  Watch your condition for any changes.  Urinate often. Avoid holding urine for long periods of time.  After a bowel movement or urination, women should cleanse from front to back, using each tissue only once.  Urinate after sexual intercourse.  Keep all follow-up visits as told by your health care provider. This is important.  If you had any tests done to find the cause of dysuria, it is up to you to get your test results. Ask your health care provider, or the department that is doing the test, when your results will be ready. Eating and drinking   Drink enough fluid to keep your urine pale yellow.  Avoid caffeine, tea, and alcohol. They can irritate the bladder and make dysuria worse. In men, alcohol may irritate the prostate. Medicines  Take over-the-counter and prescription medicines only  as told by your health care provider.  If you were prescribed an antibiotic medicine, take it as told by your health care provider. Do not stop taking the antibiotic even if you start to feel better. Contact a health care provider if:  You have a fever.  You develop pain in your back or sides.  You have nausea or vomiting.  You have blood in your urine.  You are not urinating as often as you usually do. Get help right away if:  Your pain is severe and not relieved with medicines.  You cannot eat or drink without vomiting.  You are confused.  You have a rapid heartbeat while at rest.  You have shaking or chills.  You feel extremely weak. Summary  Dysuria is pain or discomfort while urinating. Many different conditions can lead to dysuria.  If you have dysuria, you may have to urinate frequently or have the sudden feeling that you have to urinate (urgency).  Watch your condition for any changes. Keep all follow-up visits as told by your health care provider.  Make sure that you urinate often and drink enough fluid to keep your urine pale yellow. This information is not intended to replace advice given to you by your health care provider. Make sure you discuss any questions you have with your health care provider. Document Revised: 07/04/2017 Document Reviewed: 05/08/2017 Elsevier Patient Education  2020 [employer].  Pregnancy and Sexually Transmitted Infections An STI (sexually transmitted infection) is a disease or infection that may be  passed (transmitted) from person to person, usually during sexual activity. This may happen by way of saliva, semen, blood, vaginal mucus, or urine. An STI can be caused by bacteria, viruses, or parasites. During pregnancy, STIs can be dangerous for you and your unborn baby. It is important to take steps to reduce your chances of getting an STI. Also, you need to be seen by your health care provider right away if you think you may have an  STI, or if you think you may have been exposed to an STI. Diagnosis and treatment will depend on the type of STI. If you are already pregnant, you will be screened for HIV (human immunodeficiency virus) early in your pregnancy. If you are at high risk for HIV, this test may be repeated during your third trimester of pregnancy. What are some common STIs? There are different types of STIs. Some STIs that cause problems in pregnancy include:  Gonorrhea.  Chlamydia.  Syphilis.  HIV and AIDS (acquired immunodeficiency syndrome).  Genital herpes.  Hepatitis.  Genital warts.  Human papillomavirus (HPV).  Trichomoniasis. STIs that do not affect the baby include:  Chancroid.  Pubic lice. What are the possible effects of STIs during pregnancy? STIs can cause:  Stillbirth.  Miscarriage.  Premature labor.  Premature rupture of the membranes.  Serious birth defects or deformities.  Infection of the amniotic sac.  Infections that occur after birth (postpartum) in you and the baby.  Slowed growth of the baby before birth.  Illnesses in newborns. What are common symptoms of STIs? Different STIs have different symptoms. Some women may not have any symptoms. If symptoms are present, they may include:  Painful or bloody urination.  Pain in the pelvis, abdomen, vagina, anus, throat, or eyes.  A skin rash, itching, or irritation.  Growths, ulcerations, blisters, or sores in the genital and anal areas.  Fever.  Abnormal vaginal discharge, with or without bad odor.  Pain or bleeding during sexual intercourse.  Yellowing of the skin and the white parts of the eyes (jaundice).  Swollen glands in the groin area. Even if symptoms are not present, an STI can still be passed to another person during sexual contact. How are STIs diagnosed? Your health care provider can use tests to determine if you have an STI. These may include blood tests, urine tests, and tests performed  during a pelvic exam. You should be screened for STIs, including gonorrhea and chlamydia, if:  You are sexually active and are younger than age 17.  You are age 78 or older and your health care provider tells you that you are at risk for these types of infections.  Your sexual activity has changed since the last time you were screened, and you are at an increased risk for chlamydia or gonorrhea. Ask your health care provider if you are at risk. How can I reduce my risk of getting an STI? Take these actions to reduce your risk of getting an STI:  Do not have any oral, vaginal, or anal sex. This is known as practicing abstinence.  If you have sex, use a latex condom or a female condom consistently and correctly during sexual intercourse.  Use dental dams and water-soluble lubricants during sexual activity. Do not use petroleum jelly or oils.  Avoid having multiple sexual partners.  Do not have sex with someone who has other sexual partners.  Do not have sex with anyone you do not know or who is at high risk for an STI.  Avoid risky sex acts that can break the skin.  Do not have sex if you have open sores on your mouth or skin.  Avoid engaging in oral and anal sex acts.  Get the hepatitis vaccine. It is safe for pregnant women.  What should I do if I think I have an STI?  See your health care provider.  Tell your sexual partner or partners. They should be tested and treated for any STIs.  Do not have sex until your health care provider says it is okay. Get help right away if: Contact your health care provider right away if:  You have any symptoms of an STI.  You think that you have, or your sexual partner has, an STI even if there are no symptoms.  You think that you may have been exposed to an STI. This information is not intended to replace advice given to you by your health care provider. Make sure you discuss any questions you have with your health care  provider. Document Revised: 11/13/2018 Document Reviewed: 02/26/2016 Elsevier Patient Education  2020 [employer].

## 2020-02-04 NOTE — Progress Notes (Signed)
Established Patient Office Visit  Subjective:  Patient ID: Courtney Mcdaniel, female    DOB: 1990/12/20  Age: 29 y.o. MRN: [id number]  CC:  Chief Complaint  Patient presents with  . Acute Visit    UTI    HPI Courtney Mcdaniel is a 29 yo who presents for a 2 week onset of painful urination, burning after voiding. No frequency, urgency, hematuria, flank pain, fever/chills, N/V. She did a telephone visit on the Internet and took a 5 day course of Macrobid for this complaint. No change in her symptoms. No UA was obtained. She has no pelvic pain or vaginal complaints. No hx of STD. She is single- and would like to get a STD test. She uses condoms. LMP- 01/16/20- normal.   No past medical history on file.  No past surgical history on file.  Family History  Problem Relation Age of Onset  . Diabetes Father     Social History   Socioeconomic History  . Marital status: Single    Spouse name: Not on file  . Number of children: Not on file  . Years of education: Not on file  . Highest education level: Not on file  Occupational History    Employer: [employer]  Tobacco Use  . Smoking status: Never Smoker  . Smokeless tobacco: Never Used  Substance and Sexual Activity  . Alcohol use: No    Comment: 1 -2 glasses of wine daily  . Drug use: No  . Sexual activity: Not on file  Other Topics Concern  . Not on file  Social History Narrative  . Not on file   Social Determinants of Health   Financial Resource Strain:   . Difficulty of Paying Living Expenses:   Food Insecurity:   . Worried About Programme researcher, broadcasting/film/video in the Last Year:   . Barista in the Last Year:   Transportation Needs:   . Freight forwarder (Medical):   Marland Kitchen Lack of Transportation (Non-Medical):   Physical Activity:   . Days of Exercise per Week:   . Minutes of Exercise per Session:   Stress:   . Feeling of Stress :   Social Connections:   . Frequency of Communication with Friends and Family:   .  Frequency of Social Gatherings with Friends and Family:   . Attends Religious Services:   . Active Member of Clubs or Organizations:   . Attends Banker Meetings:   Marland Kitchen Marital Status:   Intimate Partner Violence:   . Fear of Current or Ex-Partner:   . Emotionally Abused:   Marland Kitchen Physically Abused:   . Sexually Abused:     Outpatient Medications Prior to Visit  Medication Sig Dispense Refill  . loratadine-pseudoephedrine (CLARITIN-D 24-HOUR) 10-240 MG 24 hr tablet Take 1 tablet by mouth daily.    . diphenhydrAMINE (BENADRYL) 25 MG tablet Take 25 mg by mouth every 6 (six) hours as needed. (Patient not taking: Reported on 02/04/2020)    . hydrOXYzine (ATARAX/VISTARIL) 25 MG tablet Take 1 tablet (25 mg total) by mouth every 8 (eight) hours as needed for anxiety. (Patient not taking: Reported on 02/04/2020) 12 tablet 0  . ipratropium (ATROVENT) 0.03 % nasal spray Place 2 sprays into both nostrils 2 (two) times daily. (Patient not taking: Reported on 02/04/2020) 30 mL 0  . predniSONE (STERAPRED UNI-PAK 21 TAB) 10 MG (21) TBPK tablet Take by mouth daily. Take as directed. (Patient not taking: Reported on 02/04/2020) 21  tablet 0   No facility-administered medications prior to visit.    No Known Allergies  ROS Review of Systems  Constitutional: Negative.  Negative for chills and fever.  HENT: Negative.   Eyes: Negative.   Respiratory: Negative.   Cardiovascular: Negative.   Endocrine: Negative.   Genitourinary: Positive for dysuria. Negative for flank pain, frequency, hematuria, menstrual problem, pelvic pain, urgency and vaginal discharge.  Musculoskeletal: Negative.   Skin: Negative.   Neurological: Negative.   Hematological: Negative.   Psychiatric/Behavioral:       No concerns with depression/anxiety      Objective:    Physical Exam Vitals reviewed.  Constitutional:      Appearance: Normal appearance.  HENT:     Head: Normocephalic.  Cardiovascular:     Rate and Rhythm:  Normal rate and regular rhythm.     Pulses: Normal pulses.     Heart sounds: Normal heart sounds.  Pulmonary:     Effort: Pulmonary effort is normal.     Breath sounds: Normal breath sounds.  Abdominal:     General: Abdomen is flat.     Palpations: Abdomen is soft.     Tenderness: There is no abdominal tenderness.  Musculoskeletal:        General: Normal range of motion.     Cervical back: Normal range of motion and neck supple.  Skin:    General: Skin is warm and dry.  Neurological:     General: No focal deficit present.     Mental Status: She is alert and oriented to person, place, and time.  Psychiatric:        Mood and Affect: Mood normal.        Behavior: Behavior normal.        Thought Content: Thought content normal.        Judgment: Judgment normal.     BP 112/72 (BP Location: Left Arm, Patient Position: Sitting, Cuff Size: Small)   Pulse 82   Temp 97.6 F (36.4 C) (Oral)   Ht 5\' 2"  (1.575 m)   Wt 97 lb (44 kg)   SpO2 98%   BMI 17.74 kg/m  Wt Readings from Last 3 Encounters:  02/04/20 97 lb (44 kg)  09/03/17 104 lb (47.2 kg)  08/11/17 112 lb (50.8 kg)    Health Maintenance Due  Topic Date Due  . Hepatitis C Screening  Never done  . HIV Screening  Never done  . TETANUS/TDAP  Never done  . PAP SMEAR-Modifier  Never done    There are no preventive care reminders to display for this patient.   Chemistry      Component Value Date/Time   NA 138 11/24/2017 1729   K 4.0 11/24/2017 1729   CL 104 11/24/2017 1729   CO2 25 09/03/2017 0929   BUN 13 11/24/2017 1729   CREATININE 1.00 11/24/2017 1729      Component Value Date/Time   CALCIUM 9.3 09/03/2017 0929   ALKPHOS 28 (L) 09/03/2017 0929   AST 21 09/03/2017 0929   ALT 33 09/03/2017 0929   BILITOT 1.4 (H) 09/03/2017 0929       Assessment & Plan:   Problem List Items Addressed This Visit      Other   Dysuria - Primary   Relevant Orders   POCT Urinalysis Dipstick (Completed)   Urine Culture    Urine Microscopic Only   Screening for STD (sexually transmitted disease)   Relevant Orders   Acute Hep Panel & Hep  B Surface Ab   HIV Antibody (routine testing w rflx)   RPR   Cytology, urine      No orders of the defined types were placed in this encounter.  We have sent off your urine and will call you when results are available.   I will send off urine screening and blood for sexually transmitted disease screen.   Please schedule a pap test and gyn exam with me or with your [facility] family physician.   Follow-up: No follow-ups on file.  This visit occurred during the SARS-CoV-2 public health emergency.  Safety protocols were in place, including screening questions prior to the visit, additional usage of staff PPE, and extensive cleaning of exam room while observing appropriate contact time as indicated for disinfecting solutions.    Amedeo Kinsman, NP

## 2020-02-05 LAB — ACUTE HEP PANEL AND HEP B SURFACE AB
Hep A IgM: NEGATIVE
Hep B C IgM: NEGATIVE
Hep C Virus Ab: <0.1 s/co ratio (ref 0.0–0.9)
Hepatitis B Surf Ab Quant: >1000 m[IU]/mL (ref >9.9)
Hepatitis B Surface Ag: NEGATIVE

## 2020-02-05 LAB — URINE CULTURE
MICRO NUMBER:: 10663063
Result:: NO GROWTH
SPECIMEN QUALITY:: ADEQUATE

## 2020-02-05 LAB — HIV ANTIBODY (ROUTINE TESTING W REFLEX): HIV Screen 4th Generation wRfx: NONREACTIVE

## 2020-02-05 LAB — RPR: RPR Ser Ql: NONREACTIVE

## 2020-02-07 ENCOUNTER — Encounter: Payer: Self-pay | Admitting: Nurse Practitioner

## 2020-02-08 ENCOUNTER — Telehealth: Payer: Self-pay | Admitting: Nurse Practitioner

## 2020-02-08 LAB — CYTOLOGY, URINE

## 2020-02-08 NOTE — Telephone Encounter (Signed)
Pt wants to have glucose/blood sugar checked to make sure she does not have anything going on with that. Does pt need to fast prior to appt on 02/11/20? Please advise

## 2020-02-08 NOTE — Telephone Encounter (Signed)
Left message on patients voicemail about fasting lab appt

## 2020-02-10 ENCOUNTER — Telehealth: Payer: Self-pay | Admitting: Nurse Practitioner

## 2020-02-10 LAB — CHLAMYDIA/GONOCOCCUS/TRICHOMONAS, NAA
Chlamydia by NAA: NEGATIVE
Gonococcus by NAA: NEGATIVE
Trich vag by NAA: NEGATIVE

## 2020-02-10 LAB — SPECIMEN STATUS REPORT

## 2020-02-10 NOTE — Telephone Encounter (Signed)
Pt needs lab results-Please call

## 2020-02-10 NOTE — Telephone Encounter (Signed)
Patient has called requesting her lab results. I can call her once they are sent to me unless you want to discuss with her

## 2020-02-10 NOTE — Telephone Encounter (Signed)
I spoke to her and gave her the results. She is on my schedule next Fri for PAP and wants a lump checked with a pelvic exam.    She does not use My chart, so she did not see my message on 02/07/2020.   Hi Ninetta,    Your urine did not grow any bacteria- no urinary tract infection to explain your symptoms. Hydrate well. We can discuss your urinary burning at your next office visit or if you are bothered with it, I can give you a referral to Urology.    Your RPR-syphilis and HIV were negative-  normal. You have no Hepatitis A or Hepatitis C. You have immunity to Hepatitis B. You have negative GC/Chlamydia or trichomonas. Your  STD work up was normal.    Take care,    Fransico Setters, NP

## 2020-02-11 ENCOUNTER — Ambulatory Visit: Payer: Managed Care, Other (non HMO) | Admitting: Nurse Practitioner

## 2020-02-15 ENCOUNTER — Telehealth: Payer: Self-pay | Admitting: Nurse Practitioner

## 2020-02-15 NOTE — Telephone Encounter (Signed)
LMTCB

## 2020-02-15 NOTE — Telephone Encounter (Signed)
Pt keep calling to change what she wants done and want to know what you do  When given a physical,she has been told that if she ask about other thing other than a physical during appointment she would have to pay for it.

## 2020-02-15 NOTE — Telephone Encounter (Signed)
Spoke with patient about her appt on Friday; patient had some billing questions and questions about her labs that were answered.

## 2020-02-18 ENCOUNTER — Encounter: Payer: Self-pay | Admitting: Nurse Practitioner

## 2020-02-18 ENCOUNTER — Other Ambulatory Visit: Payer: Self-pay

## 2020-02-18 ENCOUNTER — Ambulatory Visit (INDEPENDENT_AMBULATORY_CARE_PROVIDER_SITE_OTHER): Payer: Managed Care, Other (non HMO) | Admitting: Nurse Practitioner

## 2020-02-18 VITALS — BP 114/70 | HR 88 | Temp 97.3°F | Resp 16 | Ht 62.5 in | Wt 98.0 lb

## 2020-02-18 DIAGNOSIS — R636 Underweight: Secondary | ICD-10-CM | POA: Diagnosis not present

## 2020-02-18 DIAGNOSIS — Z0000 Encounter for general adult medical examination without abnormal findings: Secondary | ICD-10-CM | POA: Diagnosis not present

## 2020-02-18 DIAGNOSIS — K4090 Unilateral inguinal hernia, without obstruction or gangrene, not specified as recurrent: Secondary | ICD-10-CM | POA: Insufficient documentation

## 2020-02-18 NOTE — Patient Instructions (Addendum)
To the lab today.   Referral for PAP test to GYN.  Referral to general surgeon for right groin hernia.  Increase calories and regular exercise. Recommended increasing water intake and doing physical activity daily of at least some walking and also doing aerobic activity and weightlifting at least 2-3 times a week to help promote overall health and weight loss.  Patient always wears seatbelt when in car.    Patient needs to  see dentist and eye doctor regularly.Please make appointments.  You are due for tetanus and vaccine he can get this at your pharmacy.  Inguinal Hernia, Adult An inguinal hernia develops when fat or the intestines push through a weak spot in a muscle where your leg meets your lower abdomen (groin). This creates a bulge. This kind of hernia could also be:  In your scrotum, if you are female.  In folds of skin around your vagina, if you are female. There are three types of inguinal hernias:  Hernias that can be pushed back into the abdomen (are reducible). This type rarely causes pain.  Hernias that are not reducible (are incarcerated).  Hernias that are not reducible and lose their blood supply (are strangulated). This type of hernia requires emergency surgery. What are the causes? This condition is caused by having a weak spot in the muscles or tissues in the groin. This weak spot develops over time. The hernia may poke through the weak spot when you suddenly strain your lower abdominal muscles, such as when you:  Lift a heavy object.  Strain to have a bowel movement. Constipation can lead to straining.  Cough. What increases the risk? This condition is more likely to develop in:  Men.  Pregnant women.  People who: ? Are overweight. ? Work in jobs that require long periods of standing or heavy lifting. ? Have had an inguinal hernia before. ? Smoke or have lung disease. These factors can lead to long-lasting (chronic) coughing. What are the signs or  symptoms? Symptoms may depend on the size of the hernia. Often, a small inguinal hernia has no symptoms. Symptoms of a larger hernia may include:  A lump in the groin area. This is easier to see when standing. It might not be visible when lying down.  Pain or burning in the groin. This may get worse when lifting, straining, or coughing.  A dull ache or a feeling of pressure in the groin.  In men, an unusual lump in the scrotum. Symptoms of a strangulated inguinal hernia may include:  A bulge in your groin that is very painful and tender to the touch.  A bulge that turns red or purple.  Fever, nausea, and vomiting.  Inability to have a bowel movement or to pass gas. How is this diagnosed? This condition is diagnosed based on your symptoms, your medical history, and a physical exam. Your health care provider may feel your groin area and ask you to cough. How is this treated? Treatment depends on the size of your hernia and whether you have symptoms. If you do not have symptoms, your health care provider may have you watch your hernia carefully and have you come in for follow-up visits. If your hernia is large or if you have symptoms, you may need surgery to repair the hernia. Follow these instructions at home: Lifestyle  Avoid lifting heavy objects.  Avoid standing for long periods of time.  Do not use any products that contain nicotine or tobacco, such as cigarettes and e-cigarettes.  If you need help quitting, ask your health care provider.  Maintain a healthy weight. Preventing constipation  Take actions to prevent constipation. Constipation leads to straining with bowel movements, which can make a hernia worse or cause a hernia repair to break down. Your health care provider may recommend that you: ? Drink enough fluid to keep your urine pale yellow. ? Eat foods that are high in fiber, such as fresh fruits and vegetables, whole grains, and beans. ? Limit foods that are high in  fat and processed sugars, such as fried or sweet foods. ? Take an over-the-counter or prescription medicine for constipation. General instructions  You may try to push the hernia back in place by very gently pressing on it while lying down. Do not try to force the bulge back in if it will not push in easily.  Watch your hernia for any changes in shape, size, or color. Get help right away if you notice any changes.  Take over-the-counter and prescription medicines only as told by your health care provider.  Keep all follow-up visits as told by your health care provider. This is important. Contact a health care provider if:  You have a fever.  You develop new symptoms.  Your symptoms get worse. Get help right away if:  You have pain in your groin that suddenly gets worse.  You have a bulge in your groin that: ? Suddenly gets bigger and does not get smaller. ? Becomes red or purple or painful to the touch.  You are a man and you have a sudden pain in your scrotum, or the size of your scrotum suddenly changes.  You cannot push the hernia back in place by very gently pressing on it when you are lying down. Do not try to force the bulge back in if it will not push in easily.  You have nausea or vomiting that does not go away.  You have a fast heartbeat.  You cannot have a bowel movement or pass gas. These symptoms may represent a serious problem that is an emergency. Do not wait to see if the symptoms will go away. Get medical help right away. Call your local emergency services (911 in the [place] Summary  An inguinal hernia develops when fat or the intestines push through a weak spot in a muscle where your leg meets your lower abdomen (groin).  This condition is caused by having a weak spot in muscles or tissue in your groin.  Symptoms may depend on the size of the hernia, and they may include pain or swelling in your groin. A small inguinal hernia often has no  symptoms.  Treatment may not be needed if you do not have symptoms. If you have symptoms or a large hernia, you may need surgery to repair the hernia.  Avoid lifting heavy objects. Also avoid standing for long amounts of time. This information is not intended to replace advice given to you by your health care provider. Make sure you discuss any questions you have with your health care provider. Document Revised: 08/23/2017 Document Reviewed: 04/23/2017 Elsevier Patient Education  2020 [place] 66-76 Years Old, Female Preventive care refers to visits with your health care provider and lifestyle choices that can promote health and wellness. This includes:  A yearly physical exam. This may also be called an annual well check.  Regular dental visits and eye exams.  Immunizations.  Screening for certain conditions.  Healthy lifestyle choices, such as eating  a healthy diet, getting regular exercise, not using drugs or products that contain nicotine and tobacco, and limiting alcohol use. What can I expect for my preventive care visit? Physical exam Your health care provider will check your:  Height and weight. This may be used to calculate body mass index (BMI), which tells if you are at a healthy weight.  Heart rate and blood pressure.  Skin for abnormal spots. Counseling Your health care provider may ask you questions about your:  Alcohol, tobacco, and drug use.  Emotional well-being.  Home and relationship well-being.  Sexual activity.  Eating habits.  Work and work Statistician.  Method of birth control.  Menstrual cycle.  Pregnancy history. What immunizations do I need?  Influenza (flu) vaccine  This is recommended every year. Tetanus, diphtheria, and pertussis (Tdap) vaccine  You may need a Td booster every 10 years. Varicella (chickenpox) vaccine  You may need this if you have not been vaccinated. Human papillomavirus (HPV)  vaccine  If recommended by your health care provider, you may need three doses over 6 months. Measles, mumps, and rubella (MMR) vaccine  You may need at least one dose of MMR. You may also need a second dose. Meningococcal conjugate (MenACWY) vaccine  One dose is recommended if you are age 51-21 years and a first-year college student living in a residence hall, or if you have one of several medical conditions. You may also need additional booster doses. Pneumococcal conjugate (PCV13) vaccine  You may need this if you have certain conditions and were not previously vaccinated. Pneumococcal polysaccharide (PPSV23) vaccine  You may need one or two doses if you smoke cigarettes or if you have certain conditions. Hepatitis A vaccine  You may need this if you have certain conditions or if you travel or work in places where you may be exposed to hepatitis A. Hepatitis B vaccine  You may need this if you have certain conditions or if you travel or work in places where you may be exposed to hepatitis B. Haemophilus influenzae type b (Hib) vaccine  You may need this if you have certain conditions. You may receive vaccines as individual doses or as more than one vaccine together in one shot (combination vaccines). Talk with your health care provider about the risks and benefits of combination vaccines. What tests do I need?  Blood tests  Lipid and cholesterol levels. These may be checked every 5 years starting at age 71.  Hepatitis C test.  Hepatitis B test. Screening  Diabetes screening. This is done by checking your blood sugar (glucose) after you have not eaten for a while (fasting).  Sexually transmitted disease (STD) testing.  BRCA-related cancer screening. This may be done if you have a family history of breast, ovarian, tubal, or peritoneal cancers.  Pelvic exam and Pap test. This may be done every 3 years starting at age 75. Starting at age 62, this may be done every 5 years if  you have a Pap test in combination with an HPV test. Talk with your health care provider about your test results, treatment options, and if necessary, the need for more tests. Follow these instructions at home: Eating and drinking   Eat a diet that includes fresh fruits and vegetables, whole grains, lean protein, and low-fat dairy.  Take vitamin and mineral supplements as recommended by your health care provider.  Do not drink alcohol if: ? Your health care provider tells you not to drink. ? You are pregnant, may be  pregnant, or are planning to become pregnant.  If you drink alcohol: ? Limit how much you have to 0-1 drink a day. ? Be aware of how much alcohol is in your drink. In the [place] one drink equals one 12 oz bottle of beer (355 mL), one 5 oz glass of wine (148 mL), or one 1 oz glass of hard liquor (44 mL). Lifestyle  Take daily care of your teeth and gums.  Stay active. Exercise for at least 30 minutes on 5 or more days each week.  Do not use any products that contain nicotine or tobacco, such as cigarettes, e-cigarettes, and chewing tobacco. If you need help quitting, ask your health care provider.  If you are sexually active, practice safe sex. Use a condom or other form of birth control (contraception) in order to prevent pregnancy and STIs (sexually transmitted infections). If you plan to become pregnant, see your health care provider for a preconception visit. What's next?  Visit your health care provider once a year for a well check visit.  Ask your health care provider how often you should have your eyes and teeth checked.  Stay up to date on all vaccines. This information is not intended to replace advice given to you by your health care provider. Make sure you discuss any questions you have with your health care provider. Document Revised: 04/02/2018 Document Reviewed: 04/02/2018 Elsevier Patient Education  2020 [employer].

## 2020-02-18 NOTE — Progress Notes (Signed)
Established Patient Office Visit  Subjective:  Patient ID: Courtney Mcdaniel, female    DOB: 05/03/1991  Age: 29 y.o. MRN: [id number]  CC:  Chief Complaint  Patient presents with  . Annual Exam    HPI Courtney Mcdaniel is a 29 year old patient who establish care on 02/04/2020 for concerns over painful urination, burning after voiding.  She completed a 5-day course of Macrobid via telephone visit on the Internet.  She continued to have burning and requested STD testing.  Urinalysis and urine STD testing and RPR/HIV returned negative. Last menstrual period 01/16/2020 normal.  She has used condoms for birth control ad STD prevention.  She has noted a painless  lump in her right groin that she would like evaluated.  The urinary burning has resolved.  Presents for CPE and has new insurance and would like routine lab testing.  Past Medical History:  Diagnosis Date  . Allergy   . Anxiety     History reviewed. No pertinent surgical history.  Pap not completed- too uncomfortable  Patient presents today for complete physical.  Immunizations: tetanus due Diet: Regular diet Exercise: No regular exercise  Dentist:no  Vision: no   Family History  Problem Relation Age of Onset  . Diabetes Father     Social History   Socioeconomic History  . Marital status: Single    Spouse name: Not on file  . Number of children: Not on file  . Years of education: Not on file  . Highest education level: Not on file  Occupational History    Employer: [employer]  Tobacco Use  . Smoking status: Never Smoker  . Smokeless tobacco: Never Used  Substance and Sexual Activity  . Alcohol use: No    Comment: 1 -2 glasses of wine daily  . Drug use: No  . Sexual activity: Not on file  Other Topics Concern  . Not on file  Social History Narrative  . Not on file   Social Determinants of Health   Financial Resource Strain:   . Difficulty of Paying Living Expenses:   Food Insecurity:   . Worried About  Charity fundraiser in the Last Year:   . Arboriculturist in the Last Year:   Transportation Needs:   . Film/video editor (Medical):   Marland Kitchen Lack of Transportation (Non-Medical):   Physical Activity:   . Days of Exercise per Week:   . Minutes of Exercise per Session:   Stress:   . Feeling of Stress :   Social Connections:   . Frequency of Communication with Friends and Family:   . Frequency of Social Gatherings with Friends and Family:   . Attends Religious Services:   . Active Member of Clubs or Organizations:   . Attends Archivist Meetings:   Marland Kitchen Marital Status:   Intimate Partner Violence:   . Fear of Current or Ex-Partner:   . Emotionally Abused:   Marland Kitchen Physically Abused:   . Sexually Abused:     Outpatient Medications Prior to Visit  Medication Sig Dispense Refill  . loratadine-pseudoephedrine (CLARITIN-D 24-HOUR) 10-240 MG 24 hr tablet Take 1 tablet by mouth daily.     No facility-administered medications prior to visit.    No Known Allergies  ROS Review of Systems  Constitutional: Negative.  Negative for chills and fever.  HENT: Negative for congestion and sinus pressure.   Eyes: Negative.   Respiratory: Negative for cough and shortness of breath.   Cardiovascular: Negative  for chest pain, palpitations and leg swelling.  Gastrointestinal: Negative for abdominal pain.  Endocrine: Negative.   Genitourinary: Negative.  Negative for dysuria, flank pain, genital sores, hematuria, menstrual problem, pelvic pain, vaginal bleeding, vaginal discharge and vaginal pain.  Musculoskeletal: Negative.   Allergic/Immunologic: Negative.   Neurological: Negative.   Hematological: Negative.   Psychiatric/Behavioral: Negative.       Objective:    Physical Exam Vitals reviewed. Exam conducted with a chaperone present.  Constitutional:      Appearance: Normal appearance.     Comments: Underweight  HENT:     Head: Normocephalic.     Right Ear: Tympanic membrane  normal.     Left Ear: Tympanic membrane normal.  Eyes:     Conjunctiva/sclera: Conjunctivae normal.     Pupils: Pupils are equal, round, and reactive to light.  Cardiovascular:     Rate and Rhythm: Normal rate and regular rhythm.     Pulses: Normal pulses.     Heart sounds: Normal heart sounds.  Pulmonary:     Effort: Pulmonary effort is normal.     Breath sounds: Normal breath sounds.  Abdominal:     General: Abdomen is flat.     Tenderness: There is no abdominal tenderness.     Hernia: A hernia is present. Hernia is present in the right inguinal area.  Genitourinary:    General: Normal vulva.     Vagina: Vaginal discharge present.       Comments: External exam shows a right inguinal hernia.   Vaginal exam: Normal external genitalia. Normal vulva.  Speculum placed gently and with explanations. The patient  found this very uncomfortable. Re- positioned- still very uncomfortable and the patient became tense. The exam was discontinued. No supra pubic tenderness. The cervix was not visualized and the pap test was not performed.  Musculoskeletal:     Cervical back: Normal range of motion and neck supple.  Neurological:     Mental Status: She is alert.  Psychiatric:        Mood and Affect: Mood normal.        Behavior: Behavior normal.     BP 114/70   Pulse 88   Temp (!) 97.3 F (36.3 C)   Resp 16   Ht 5' 2.5" (1.588 m)   Wt 98 lb (44.5 kg)   SpO2 99%   BMI 17.64 kg/m  Wt Readings from Last 3 Encounters:  02/18/20 98 lb (44.5 kg)  02/04/20 97 lb (44 kg)  09/03/17 104 lb (47.2 kg)     Health Maintenance Due  Topic Date Due  . TETANUS/TDAP  Never done  . PAP SMEAR-Modifier  Never done    There are no preventive care reminders to display for this patient.  Lab Results  Component Value Date   TSH 1.070 02/18/2020   Lab Results  Component Value Date   WBC 3.1 (L) 02/18/2020   HGB 13.2 02/18/2020   HCT 39.4 02/18/2020   MCV 90 02/18/2020   PLT 203 02/18/2020     Lab Results  Component Value Date   NA 136 02/18/2020   K 4.1 02/18/2020   CO2 23 02/18/2020   GLUCOSE 69 02/18/2020   BUN 14 02/18/2020   CREATININE 0.79 02/18/2020   BILITOT 1.0 02/18/2020   ALKPHOS 30 (L) 02/18/2020   AST 22 02/18/2020   ALT 24 02/18/2020   PROT 7.0 02/18/2020   ALBUMIN 4.6 02/18/2020   CALCIUM 8.8 02/18/2020   GFR 102.14 09/03/2017  Lab Results  Component Value Date   CHOL 152 02/18/2020   Lab Results  Component Value Date   HDL 102 02/18/2020   Lab Results  Component Value Date   LDLCALC 42 02/18/2020   Lab Results  Component Value Date   TRIG 27 02/18/2020   Lab Results  Component Value Date   CHOLHDL 1.5 02/18/2020   Lab Results  Component Value Date   HGBA1C 5.0 02/18/2020      Assessment & Plan:   Problem List Items Addressed This Visit      Other   Preventative health care - Primary   Relevant Orders   Ambulatory referral to Obstetrics / Gynecology   Vitamin D (25 hydroxy) (Completed)   TSH (Completed)   Lipid panel (Completed)   Hemoglobin A1c (Completed)   Comp Met (CMET) (Completed)   CBC w/Diff (Completed)   Underweight due to inadequate caloric intake   Right inguinal hernia   Relevant Orders   Ambulatory referral to General Surgery      No orders of the defined types were placed in this encounter.   Referral for PAP test to GYN.She did not tolerate the speculum exam and recommend using a small speculum size.  No pelvic/supra pubic pain or discomfort on exam. Trauma-informed care provided.   Referral to general surgeon for right groin hernia.  Increase calories and regular exercise. Recommended increasing water intake and doing physical activity daily of at least some walking and also doing aerobic activity and weightlifting at least 2-3 times a week to help promote overall health and weight loss.  Patient always wears seatbelt when in car.    Patient needs to  see dentist and eye doctor regularly.Please  make appointments.  You are due for tetanus and vaccine he can get this at your pharmacy.  Follow-up: Return in about 3 months (around 05/20/2020).   This visit occurred during the SARS-CoV-2 public health emergency.  Safety protocols were in place, including screening questions prior to the visit, additional usage of staff PPE, and extensive cleaning of exam room while observing appropriate contact time as indicated for disinfecting solutions.   Denice Paradise, NP

## 2020-02-19 LAB — COMPREHENSIVE METABOLIC PANEL
ALT: 24 IU/L (ref 0–32)
AST: 22 IU/L (ref 0–40)
Albumin/Globulin Ratio: 1.9 (ref 1.2–2.2)
Albumin: 4.6 g/dL (ref 3.9–5.0)
Alkaline Phosphatase: 30 IU/L — ABNORMAL LOW (ref 48–121)
BUN/Creatinine Ratio: 18 (ref 9–23)
BUN: 14 mg/dL (ref 6–20)
Bilirubin Total: 1 mg/dL (ref 0.0–1.2)
CO2: 23 mmol/L (ref 20–29)
Calcium: 8.8 mg/dL (ref 8.7–10.2)
Chloride: 100 mmol/L (ref 96–106)
Creatinine, Ser: 0.79 mg/dL (ref 0.57–1.00)
GFR calc Af Amer: 116 mL/min/{1.73_m2} (ref >59)
GFR calc non Af Amer: 101 mL/min/{1.73_m2} (ref >59)
Globulin, Total: 2.4 g/dL (ref 1.5–4.5)
Glucose: 69 mg/dL (ref 65–99)
Potassium: 4.1 mmol/L (ref 3.5–5.2)
Sodium: 136 mmol/L (ref 134–144)
Total Protein: 7 g/dL (ref 6.0–8.5)

## 2020-02-19 LAB — LIPID PANEL
Chol/HDL Ratio: 1.5 ratio (ref 0.0–4.4)
Cholesterol, Total: 152 mg/dL (ref 100–199)
HDL: 102 mg/dL (ref >39)
LDL Chol Calc (NIH): 42 mg/dL (ref 0–99)
Triglycerides: 27 mg/dL (ref 0–149)
VLDL Cholesterol Cal: 8 mg/dL (ref 5–40)

## 2020-02-19 LAB — HEMOGLOBIN A1C
Est. average glucose Bld gHb Est-mCnc: 97 mg/dL
Hgb A1c MFr Bld: 5 % (ref 4.8–5.6)

## 2020-02-19 LAB — CBC WITH DIFFERENTIAL/PLATELET
Basophils Absolute: 0.1 10*3/uL (ref 0.0–0.2)
Basos: 2 %
EOS (ABSOLUTE): 0.3 10*3/uL (ref 0.0–0.4)
Eos: 9 %
Hematocrit: 39.4 % (ref 34.0–46.6)
Hemoglobin: 13.2 g/dL (ref 11.1–15.9)
Immature Grans (Abs): 0 10*3/uL (ref 0.0–0.1)
Immature Granulocytes: 0 %
Lymphocytes Absolute: 1.5 10*3/uL (ref 0.7–3.1)
Lymphs: 48 %
MCH: 30.3 pg (ref 26.6–33.0)
MCHC: 33.5 g/dL (ref 31.5–35.7)
MCV: 90 fL (ref 79–97)
Monocytes Absolute: 0.3 10*3/uL (ref 0.1–0.9)
Monocytes: 9 %
Neutrophils Absolute: 1 10*3/uL — ABNORMAL LOW (ref 1.4–7.0)
Neutrophils: 32 %
Platelets: 203 10*3/uL (ref 150–450)
RBC: 4.36 x10E6/uL (ref 3.77–5.28)
RDW: 12.4 % (ref 11.7–15.4)
WBC: 3.1 10*3/uL — ABNORMAL LOW (ref 3.4–10.8)

## 2020-02-19 LAB — VITAMIN D 25 HYDROXY (VIT D DEFICIENCY, FRACTURES): Vit D, 25-Hydroxy: 19.1 ng/mL — ABNORMAL LOW (ref 30.0–100.0)

## 2020-02-19 LAB — TSH: TSH: 1.07 u[IU]/mL (ref 0.450–4.500)

## 2020-02-20 ENCOUNTER — Encounter: Payer: Self-pay | Admitting: Nurse Practitioner

## 2020-02-23 NOTE — Telephone Encounter (Signed)
Pt called and asked to speak to PCP about results. Please advise

## 2020-02-23 NOTE — Telephone Encounter (Signed)
I spoke to her about Vit D supplements and reviewed all of her labs. All of her questions were answered. She does not want to start on Vit D supplements now and wants to eat more vit D in food and get in the sun more. Cautioned about low BMI, risk of osteoporosis as she ages and sun burn/cancer risks to limit sun exposure to 10-15 min day. We can check her Vit D level in Oct to see how her level is at that time. She is comfortable with this plan.

## 2020-05-19 ENCOUNTER — Ambulatory Visit (INDEPENDENT_AMBULATORY_CARE_PROVIDER_SITE_OTHER): Payer: Self-pay | Admitting: Certified Nurse Midwife

## 2020-05-19 ENCOUNTER — Other Ambulatory Visit (HOSPITAL_COMMUNITY)
Admission: RE | Admit: 2020-05-19 | Discharge: 2020-05-19 | Disposition: A | Discharge status: Home / Self Care | Payer: Managed Care, Other (non HMO) | Source: Ambulatory Visit | Attending: Certified Nurse Midwife | Admitting: Certified Nurse Midwife

## 2020-05-19 ENCOUNTER — Other Ambulatory Visit: Payer: Self-pay

## 2020-05-19 ENCOUNTER — Encounter: Payer: Self-pay | Admitting: Certified Nurse Midwife

## 2020-05-19 VITALS — BP 102/73 | HR 88 | Ht 62.5 in | Wt 101.6 lb

## 2020-05-19 DIAGNOSIS — Z124 Encounter for screening for malignant neoplasm of cervix: Secondary | ICD-10-CM | POA: Diagnosis present

## 2020-05-19 DIAGNOSIS — E559 Vitamin D deficiency, unspecified: Secondary | ICD-10-CM

## 2020-05-19 DIAGNOSIS — Z01419 Encounter for gynecological examination (general) (routine) without abnormal findings: Secondary | ICD-10-CM | POA: Diagnosis not present

## 2020-05-19 NOTE — Patient Instructions (Signed)
Vitamin D Deficiency Vitamin D deficiency is when your body does not have enough vitamin D. Vitamin D is important to your body because:  It helps your body use other minerals.  It helps to keep your bones strong and healthy.  It may help to prevent some diseases.  It helps your heart and other muscles work well. Not getting enough vitamin D can make your bones soft. It can also cause other health problems. What are the causes? This condition may be caused by:  Not eating enough foods that contain vitamin D.  Not getting enough sun.  Having diseases that make it hard for your body to absorb vitamin D.  Having a surgery in which a part of the stomach or a part of the small intestine is removed.  Having kidney disease or liver disease. What increases the risk? You are more likely to get this condition if:  You are older.  You do not spend much time outdoors.  You live in a nursing home.  You have had broken bones.  You have weak or thin bones (osteoporosis).  You have a disease or condition that changes how your body absorbs vitamin D.  You have dark skin.  You take certain medicines.  You are overweight or obese. What are the signs or symptoms?  In mild cases, there may not be any symptoms. If the condition is very bad, symptoms may include: ? Bone pain. ? Muscle pain. ? Falling often. ? Broken bones caused by a minor injury. How is this treated? Treatment may include taking supplements as told by your doctor. Your doctor will tell you what dose is best for you. Supplements may include:  Vitamin D.  Calcium. Follow these instructions at home: Eating and drinking   Eat foods that contain vitamin D, such as: ? Dairy products, cereals, or juices with added vitamin D. Check the label. ? Fish, such as salmon or trout. ? Eggs. ? Oysters. ? Mushrooms. The items listed above may not be a complete list of what you can eat and drink. Contact a dietitian for more  options. General instructions  Take medicines and supplements only as told by your doctor.  Get regular, safe exposure to natural sunlight.  Do not use a tanning bed.  Maintain a healthy weight. Lose weight if needed.  Keep all follow-up visits as told by your doctor. This is important. How is this prevented?  You can get vitamin D by: ? Eating foods that naturally contain vitamin D. ? Eating or drinking products that have vitamin D added to them, such as cereals, juices, and milk. ? Taking vitamin D or a multivitamin that contains vitamin D. ? Being in the sun. Your body makes vitamin D when your skin is exposed to sunlight. Your body changes the sunlight into a form of the vitamin that it can use. Contact a doctor if:  Your symptoms do not go away.  You feel sick to your stomach (nauseous).  You throw up (vomit).  You poop less often than normal, or you have trouble pooping (constipation). Summary  Vitamin D deficiency is when your body does not have enough vitamin D.  Vitamin D helps to keep your bones strong and healthy.  This condition is often treated by taking a supplement.  Your doctor will tell you what dose is best for you. This information is not intended to replace advice given to you by your health care provider. Make sure you discuss any questions you  have with your health care provider. Document Revised: 03/30/2018 Document Reviewed: 03/30/2018 Elsevier Patient Education  2020 [employer].   Pap Test Why am I having this test? A Pap test, also called a Pap smear, is a screening test to check for signs of:  Cancer of the vagina, cervix, and uterus. The cervix is the lower part of the uterus that opens into the vagina.  Infection.  Changes that may be a sign that cancer is developing (precancerous changes). Women need this test on a regular basis. In general, you should have a Pap test every 3 years until you reach menopause or age 62. Women aged  30-60 may choose to have their Pap test done at the same time as an HPV (human papillomavirus) test every 5 years (instead of every 3 years). Your health care provider may recommend having Pap tests more or less often depending on your medical conditions and past Pap test results. What kind of sample is taken?  Your health care provider will collect a sample of cells from the surface of your cervix. This will be done using a small cotton swab, plastic spatula, or brush. This sample is often collected during a pelvic exam, when you are lying on your back on an exam table with feet in footrests (stirrups). In some cases, fluids (secretions) from the cervix or vagina may also be collected. How do I prepare for this test?  Be aware of where you are in your menstrual cycle. If you are menstruating on the day of the test, you may be asked to reschedule.  You may need to reschedule if you have a known vaginal infection on the day of the test.  Follow instructions from your health care provider about: ? Changing or stopping your regular medicines. Some medicines can cause abnormal test results, such as digitalis and tetracycline. ? Avoiding douching or taking a bath the day before or the day of the test. Tell a health care provider about:  Any allergies you have.  All medicines you are taking, including vitamins, herbs, eye drops, creams, and over-the-counter medicines.  Any blood disorders you have.  Any surgeries you have had.  Any medical conditions you have.  Whether you are pregnant or may be pregnant. How are the results reported? Your test results will be reported as either abnormal or normal. A false-positive result can occur. A false positive is incorrect because it means that a condition is present when it is not. A false-negative result can occur. A false negative is incorrect because it means that a condition is not present when it is. What do the results mean? A normal test  result means that you do not have signs of cancer of the vagina, cervix, or uterus. An abnormal result may mean that you have:  Cancer. A Pap test by itself is not enough to diagnose cancer. You will have more tests done in this case.  Precancerous changes in your vagina, cervix, or uterus.  Inflammation of the cervix.  An STD (sexually transmitted disease).  A fungal infection.  A parasite infection. Talk with your health care provider about what your results mean. Questions to ask your health care provider Ask your health care provider, or the department that is doing the test:  When will my results be ready?  How will I get my results?  What are my treatment options?  What other tests do I need?  What are my next steps? Summary  In general, women should  have a Pap test every 3 years until they reach menopause or age 71.  Your health care provider will collect a sample of cells from the surface of your cervix. This will be done using a small cotton swab, plastic spatula, or brush.  In some cases, fluids (secretions) from the cervix or vagina may also be collected. This information is not intended to replace advice given to you by your health care provider. Make sure you discuss any questions you have with your health care provider. Document Revised: 03/31/2017 Document Reviewed: 03/31/2017 Elsevier Patient Education  2020 [place] 89-52 Years Old, Female Preventive care refers to visits with your health care provider and lifestyle choices that can promote health and wellness. This includes:  A yearly physical exam. This may also be called an annual well check.  Regular dental visits and eye exams.  Immunizations.  Screening for certain conditions.  Healthy lifestyle choices, such as eating a healthy diet, getting regular exercise, not using drugs or products that contain nicotine and tobacco, and limiting alcohol use. What can I expect for  my preventive care visit? Physical exam Your health care provider will check your:  Height and weight. This may be used to calculate body mass index (BMI), which tells if you are at a healthy weight.  Heart rate and blood pressure.  Skin for abnormal spots. Counseling Your health care provider may ask you questions about your:  Alcohol, tobacco, and drug use.  Emotional well-being.  Home and relationship well-being.  Sexual activity.  Eating habits.  Work and work Statistician.  Method of birth control.  Menstrual cycle.  Pregnancy history. What immunizations do I need?  Influenza (flu) vaccine  This is recommended every year. Tetanus, diphtheria, and pertussis (Tdap) vaccine  You may need a Td booster every 10 years. Varicella (chickenpox) vaccine  You may need this if you have not been vaccinated. Human papillomavirus (HPV) vaccine  If recommended by your health care provider, you may need three doses over 6 months. Measles, mumps, and rubella (MMR) vaccine  You may need at least one dose of MMR. You may also need a second dose. Meningococcal conjugate (MenACWY) vaccine  One dose is recommended if you are age 68-21 years and a first-year college student living in a residence hall, or if you have one of several medical conditions. You may also need additional booster doses. Pneumococcal conjugate (PCV13) vaccine  You may need this if you have certain conditions and were not previously vaccinated. Pneumococcal polysaccharide (PPSV23) vaccine  You may need one or two doses if you smoke cigarettes or if you have certain conditions. Hepatitis A vaccine  You may need this if you have certain conditions or if you travel or work in places where you may be exposed to hepatitis A. Hepatitis B vaccine  You may need this if you have certain conditions or if you travel or work in places where you may be exposed to hepatitis B. Haemophilus influenzae type b (Hib)  vaccine  You may need this if you have certain conditions. You may receive vaccines as individual doses or as more than one vaccine together in one shot (combination vaccines). Talk with your health care provider about the risks and benefits of combination vaccines. What tests do I need?  Blood tests  Lipid and cholesterol levels. These may be checked every 5 years starting at age 37.  Hepatitis C test.  Hepatitis B test. Screening  Diabetes screening. This  is done by checking your blood sugar (glucose) after you have not eaten for a while (fasting).  Sexually transmitted disease (STD) testing.  BRCA-related cancer screening. This may be done if you have a family history of breast, ovarian, tubal, or peritoneal cancers.  Pelvic exam and Pap test. This may be done every 3 years starting at age 42. Starting at age 55, this may be done every 5 years if you have a Pap test in combination with an HPV test. Talk with your health care provider about your test results, treatment options, and if necessary, the need for more tests. Follow these instructions at home: Eating and drinking   Eat a diet that includes fresh fruits and vegetables, whole grains, lean protein, and low-fat dairy.  Take vitamin and mineral supplements as recommended by your health care provider.  Do not drink alcohol if: ? Your health care provider tells you not to drink. ? You are pregnant, may be pregnant, or are planning to become pregnant.  If you drink alcohol: ? Limit how much you have to 0-1 drink a day. ? Be aware of how much alcohol is in your drink. In the [place] one drink equals one 12 oz bottle of beer (355 mL), one 5 oz glass of wine (148 mL), or one 1 oz glass of hard liquor (44 mL). Lifestyle  Take daily care of your teeth and gums.  Stay active. Exercise for at least 30 minutes on 5 or more days each week.  Do not use any products that contain nicotine or tobacco, such as cigarettes,  e-cigarettes, and chewing tobacco. If you need help quitting, ask your health care provider.  If you are sexually active, practice safe sex. Use a condom or other form of birth control (contraception) in order to prevent pregnancy and STIs (sexually transmitted infections). If you plan to become pregnant, see your health care provider for a preconception visit. What's next?  Visit your health care provider once a year for a well check visit.  Ask your health care provider how often you should have your eyes and teeth checked.  Stay up to date on all vaccines. This information is not intended to replace advice given to you by your health care provider. Make sure you discuss any questions you have with your health care provider. Document Revised: 04/02/2018 Document Reviewed: 04/02/2018 Elsevier Patient Education  2020 [employer].

## 2020-05-19 NOTE — Progress Notes (Signed)
ANNUAL PREVENTATIVE CARE GYN  ENCOUNTER NOTE  Subjective:       Courtney Mcdaniel is a 29 y.o. G0P0000 female here for a routine annual gynecologic exam.  Current complaints: 1.  Needs Pap smear  Denies difficulty breathing or respiratory distress, chest pain, abdominal pain, excessive vaginal bleeding, dysuria, and leg pain or swelling.    Gynecologic History  Patient's last menstrual period was 05/07/2020 (approximate). Period Cycle (Days): 28 Period Duration (Days): 5 Period Pattern: Regular Menstrual Flow: Moderate Menstrual Control: Thin pad Menstrual Control Change Freq (Hours): 3 Dysmenorrhea: None  Contraception: condoms    Upstream - 05/19/20 1607      Pregnancy Intention Screening   Does the patient want to become pregnant in the next year? No    Does the patient's partner want to become pregnant in the next year? No    Would the patient like to discuss contraceptive options today? No          The pregnancy intention screening data noted above was reviewed. Potential methods of contraception were discussed. The patient elected to proceed with Female Condom.   Last Pap: due.   Obstetric History  OB History  Gravida Para Term Preterm AB Living  0 0 0 0 0 0  SAB TAB Ectopic Multiple Live Births  0 0 0 0 0    Past Medical History:  Diagnosis Date  . Allergy   . Anxiety   . Panic attacks     Past Surgical History:  Procedure Laterality Date  . NO PAST SURGERIES      Current Outpatient Medications on File Prior to Visit  Medication Sig Dispense Refill  . loratadine-pseudoephedrine (CLARITIN-D 24-HOUR) 10-240 MG 24 hr tablet Take 1 tablet by mouth daily.     No current facility-administered medications on file prior to visit.    Allergies  Allergen Reactions  . Bee Pollen     Social History   Socioeconomic History  . Marital status: Single    Spouse name: Not on file  . Number of children: Not on file  . Years of education: Not on file  .  Highest education level: Not on file  Occupational History    Employer: [employer]  Tobacco Use  . Smoking status: Never Smoker  . Smokeless tobacco: Never Used  Vaping Use  . Vaping Use: Never used  Substance and Sexual Activity  . Alcohol use: Not Currently  . Drug use: No  . Sexual activity: Yes    Birth control/protection: Condom  Other Topics Concern  . Not on file  Social History Narrative  . Not on file   Social Determinants of Health   Financial Resource Strain:   . Difficulty of Paying Living Expenses: Not on file  Food Insecurity:   . Worried About Programme researcher, broadcasting/film/video in the Last Year: Not on file  . Ran Out of Food in the Last Year: Not on file  Transportation Needs:   . Lack of Transportation (Medical): Not on file  . Lack of Transportation (Non-Medical): Not on file  Physical Activity:   . Days of Exercise per Week: Not on file  . Minutes of Exercise per Session: Not on file  Stress:   . Feeling of Stress : Not on file  Social Connections:   . Frequency of Communication with Friends and Family: Not on file  . Frequency of Social Gatherings with Friends and Family: Not on file  . Attends Religious Services: Not on file  .  Active Member of Clubs or Organizations: Not on file  . Attends Banker Meetings: Not on file  . Marital Status: Not on file  Intimate Partner Violence:   . Fear of Current or Ex-Partner: Not on file  . Emotionally Abused: Not on file  . Physically Abused: Not on file  . Sexually Abused: Not on file    Family History  Problem Relation Age of Onset  . Diabetes Father   . Diabetes Maternal Grandmother   . Hypertension Maternal Grandmother   . Heart Problems Maternal Grandmother     The following portions of the patient's history were reviewed and updated as appropriate: allergies, current medications, past family history, past medical history, past social history, past surgical history and problem list.  Review of  Systems  ROS negative except as noted above. Information obtained from patient.    Objective:   BP 102/73   Pulse 88   Ht 5' 2.5" (1.588 m)   Wt 101 lb 9.6 oz (46.1 kg)   LMP 05/07/2020 (Approximate)   BMI 18.29 kg/m    CONSTITUTIONAL: Well-developed, well-nourished female in no acute distress.   PSYCHIATRIC: Normal mood and affect. Normal behavior. Normal judgment and thought content.  NEUROLGIC: Alert and oriented to person, place, and time. Normal muscle tone coordination.   HENT:  Normocephalic, atraumatic, External right and left ear normal.   EYES: Unable to assess, patient wearing sunglasses inside during exam.   NECK: Normal range of motion, supple, no masses.  Normal thyroid.   SKIN: Skin is warm and dry. No rash noted. Not diaphoretic. No erythema. No pallor.  CARDIOVASCULAR: Normal heart rate noted, regular rhythm, no murmur.  RESPIRATORY: Clear to auscultation bilaterally. Effort and breath sounds normal, no problems with respiration noted.  BREASTS: Symmetric in size. No masses, skin changes, nipple drainage, or lymphadenopathy.  ABDOMEN: Soft, normal bowel sounds, no distention noted.  No tenderness, rebound or guarding.   PELVIC:  External Genitalia: Normal  Vagina: Normal  Cervix: Normal, Pap collected  MUSCULOSKELETAL: Normal range of motion. No tenderness.  No cyanosis, clubbing, or edema.  2+ distal pulses.  LYMPHATIC: No Axillary, Supraclavicular, or Inguinal Adenopathy.  Assessment:   Annual gynecologic examination 29 y.o.   Contraception: condoms   Underweight   Problem List Items Addressed This Visit    None    Visit Diagnoses    Well woman exam    -  Primary   Relevant Orders   VITAMIN D 25 Hydroxy (Vit-D Deficiency, Fractures)   Cytology - PAP   Screening for cervical cancer       Relevant Orders   Cytology - PAP   Vitamin D deficiency       Relevant Orders   VITAMIN D 25 Hydroxy (Vit-D Deficiency, Fractures)      Plan:    Pap: Pap Co Test and GC/CT NAAT  Labs: See orders   Routine preventative health maintenance measures emphasized: Exercise/Diet/Weight control, Tobacco Warnings, Alcohol/Substance use risks and Stress Management; see AVS  Reviewed red flag symptoms and when to call  Return to Clinic - 1 Year for [facility] or sooner if needed   Serafina Royals, CNM  [facility], [facility] 05/19/20 4:20 PM

## 2020-05-22 ENCOUNTER — Other Ambulatory Visit: Payer: Self-pay

## 2020-05-22 ENCOUNTER — Encounter: Payer: Managed Care, Other (non HMO) | Admitting: Nurse Practitioner

## 2020-05-25 ENCOUNTER — Other Ambulatory Visit: Payer: Self-pay

## 2020-05-25 ENCOUNTER — Other Ambulatory Visit: Payer: 59

## 2020-05-25 DIAGNOSIS — E559 Vitamin D deficiency, unspecified: Secondary | ICD-10-CM

## 2020-05-25 LAB — CYTOLOGY - PAP
Chlamydia: NEGATIVE
Comment: NEGATIVE
Comment: NEGATIVE
Comment: NORMAL
Diagnosis: NEGATIVE
High risk HPV: NEGATIVE
Neisseria Gonorrhea: NEGATIVE

## 2020-05-25 NOTE — Progress Notes (Signed)
Erroneous visit

## 2020-05-26 LAB — VITAMIN D 25 HYDROXY (VIT D DEFICIENCY, FRACTURES): Vit D, 25-Hydroxy: 16.9 ng/mL — ABNORMAL LOW (ref 30.0–100.0)

## 2020-05-31 ENCOUNTER — Telehealth: Payer: Self-pay | Admitting: Nurse Practitioner

## 2020-05-31 MED ORDER — LORATADINE-PSEUDOEPHEDRINE ER 10-240 MG PO TB24: 1.0000 | ORAL_TABLET | Freq: Every day | ORAL | 1 refills | Status: DC

## 2020-05-31 NOTE — Addendum Note (Signed)
Addended by: Elise Benne T on: 05/31/2020 03:41 PM   Modules accepted: Orders

## 2020-05-31 NOTE — Telephone Encounter (Signed)
Pt needs a refill on loratadine-pseudoephedrine (CLARITIN-D 24-HOUR) 10-240 MG 24 hr sent to [facility]

## 2020-10-04 ENCOUNTER — Telehealth: Payer: Self-pay | Admitting: Nurse Practitioner

## 2020-10-04 MED ORDER — LORATADINE-PSEUDOEPHEDRINE ER 10-240 MG PO TB24: 1.0000 | ORAL_TABLET | Freq: Every day | ORAL | 0 refills | Status: DC

## 2020-10-04 NOTE — Telephone Encounter (Signed)
Refill sent.

## 2020-10-04 NOTE — Telephone Encounter (Signed)
Patient is calling for a refill on her loratadine-pseudoephedrine (CLARITIN-D 24-HOUR) 10-240 MG 24 hr tablet. Pt was advised to call back in April to make an appointment with NP.

## 2020-10-12 ENCOUNTER — Other Ambulatory Visit: Payer: Self-pay

## 2020-10-12 ENCOUNTER — Telehealth: Payer: Self-pay | Admitting: Nurse Practitioner

## 2020-10-12 DIAGNOSIS — J302 Other seasonal allergic rhinitis: Secondary | ICD-10-CM

## 2020-10-12 MED ORDER — FEXOFENADINE-PSEUDOEPHED ER 180-240 MG PO TB24: 1.0000 | ORAL_TABLET | Freq: Every day | ORAL | 1 refills | Status: AC

## 2020-10-12 NOTE — Telephone Encounter (Signed)
I called the patient and LVM informing her to call back and schedule a appointment with the new provider in April or May, refill sent to pharmacy  for further refills.  Akeisha Lagerquist,cma

## 2020-10-12 NOTE — Telephone Encounter (Signed)
Patient called in wanting a prescription for [facility] she stated that she called last week and someone refill her allergy medication but her insurance does not cover that one.

## 2021-06-15 ENCOUNTER — Encounter: Payer: Self-pay | Admitting: Internal Medicine

## 2021-06-15 ENCOUNTER — Ambulatory Visit: Payer: Managed Care, Other (non HMO) | Admitting: Internal Medicine

## 2021-06-15 ENCOUNTER — Other Ambulatory Visit: Payer: Self-pay

## 2021-06-15 VITALS — BP 100/70 | HR 79 | Temp 97.2°F | Ht 62.5 in | Wt 99.8 lb

## 2021-06-15 DIAGNOSIS — L209 Atopic dermatitis, unspecified: Secondary | ICD-10-CM | POA: Diagnosis not present

## 2021-06-15 DIAGNOSIS — D72819 Decreased white blood cell count, unspecified: Secondary | ICD-10-CM | POA: Diagnosis not present

## 2021-06-15 DIAGNOSIS — Z1389 Encounter for screening for other disorder: Secondary | ICD-10-CM

## 2021-06-15 DIAGNOSIS — E559 Vitamin D deficiency, unspecified: Secondary | ICD-10-CM

## 2021-06-15 DIAGNOSIS — Z1329 Encounter for screening for other suspected endocrine disorder: Secondary | ICD-10-CM | POA: Diagnosis not present

## 2021-06-15 DIAGNOSIS — E538 Deficiency of other specified B group vitamins: Secondary | ICD-10-CM

## 2021-06-15 DIAGNOSIS — Z23 Encounter for immunization: Secondary | ICD-10-CM

## 2021-06-15 DIAGNOSIS — Z0000 Encounter for general adult medical examination without abnormal findings: Secondary | ICD-10-CM

## 2021-06-15 MED ORDER — FLUOCINOLONE ACETONIDE SCALP 0.01 % EX OIL: 1.0000 "application " | TOPICAL_OIL | CUTANEOUS | 0 refills | Status: AC

## 2021-06-15 MED ORDER — TETANUS-DIPHTH-ACELL PERTUSSIS 5-2.5-18.5 LF-MCG/0.5 IM SUSP: 0.5000 mL | Freq: Once | INTRAMUSCULAR | 0 refills | Status: AC

## 2021-06-15 NOTE — Patient Instructions (Signed)
[facility] [employer] rosemary scalp treatment

## 2021-06-15 NOTE — Progress Notes (Signed)
Chief Complaint  Patient presents with   Hair/Scalp Problem   F/u  1. Scalp irritation since last weds had twists in hair x 6 weeks did not wash hair tried same shampoo washed hair 5x and detangler (new) and coconut oil which helped but scalp itchy irritated and she had a a lot of tension pulling hair to detangle it  She also tried lice treatment b/c she though she had lice and felt like something crawling in scalp which was new   Review of Systems  Constitutional:  Negative for weight loss.  HENT:  Negative for hearing loss.   Eyes:  Negative for blurred vision.  Respiratory:  Negative for shortness of breath.   Cardiovascular:  Negative for chest pain.  Gastrointestinal:  Negative for abdominal pain and blood in stool.  Genitourinary:  Negative for dysuria.  Musculoskeletal:  Negative for falls and joint pain.  Skin:  Positive for itching and rash.  Neurological:  Negative for headaches.  Psychiatric/Behavioral:  Negative for depression.   Past Medical History:  Diagnosis Date   Allergy    Anxiety    Panic attacks    Past Surgical History:  Procedure Laterality Date   NO PAST SURGERIES     Family History  Problem Relation Age of Onset   Diabetes Father    Diabetes Maternal Grandmother    Hypertension Maternal Grandmother    Heart Problems Maternal Grandmother    Social History   Socioeconomic History   Marital status: Single    Spouse name: Not on file   Number of children: Not on file   Years of education: Not on file   Highest education level: Not on file  Occupational History    Employer: [employer]  Tobacco Use   Smoking status: Never   Smokeless tobacco: Never  Vaping Use   Vaping Use: Never used  Substance and Sexual Activity   Alcohol use: Not Currently   Drug use: No   Sexual activity: Yes    Birth control/protection: Condom  Other Topics Concern   Not on file  Social History Narrative   [facility]   Social Determinants of Health   Financial  Resource Strain: Not on file  Food Insecurity: Not on file  Transportation Needs: Not on file  Physical Activity: Not on file  Stress: Not on file  Social Connections: Not on file  Intimate Partner Violence: Not on file   Current Meds  Medication Sig   Fluocinolone Acetonide Scalp 0.01 % OIL Apply 1 application topically as directed. APPLY 1 OUNCE 1 TO 2 TIMES PER WEEK AS NEEDED OR LEAVE ON SCALP OVERNIGHT MORE THAN 4 HOURS BEFORE WASHING OUT   Tdap (BOOSTRIX) 5-2.5-18.5 LF-MCG/0.5 injection Inject 0.5 mLs into the muscle once for 1 dose.   Allergies  Allergen Reactions   Bee Pollen    No results found for this or any previous visit (from the past 2160 hour(s)). Objective  Body mass index is 17.96 kg/m. Wt Readings from Last 3 Encounters:  06/15/21 99 lb 12.5 oz (45.3 kg)  05/19/20 101 lb 9.6 oz (46.1 kg)  02/18/20 98 lb (44.5 kg)   Temp Readings from Last 3 Encounters:  06/15/21 (!) 97.2 F (36.2 C) (Temporal)  02/18/20 (!) 97.3 F (36.3 C)  02/04/20 97.6 F (36.4 C) (Oral)   BP Readings from Last 3 Encounters:  06/15/21 100/70  05/19/20 102/73  02/18/20 114/70   Pulse Readings from Last 3 Encounters:  06/15/21 79  05/19/20 88  02/18/20 88    Physical Exam Vitals and nursing note reviewed.  Constitutional:      Appearance: Normal appearance. She is well-developed and well-groomed.  HENT:     Head: Normocephalic and atraumatic.  Eyes:     Conjunctiva/sclera: Conjunctivae normal.     Pupils: Pupils are equal, round, and reactive to light.  Cardiovascular:     Rate and Rhythm: Normal rate and regular rhythm.     Heart sounds: Normal heart sounds. No murmur heard. Pulmonary:     Effort: Pulmonary effort is normal.     Breath sounds: Normal breath sounds.  Abdominal:     General: Abdomen is flat. Bowel sounds are normal.     Tenderness: There is no abdominal tenderness.  Musculoskeletal:        General: No tenderness.  Skin:    General: Skin is warm and  dry.     Comments: Redness to scalp  Neurological:     General: No focal deficit present.     Mental Status: She is alert and oriented to person, place, and time. Mental status is at baseline.     Cranial Nerves: Cranial nerves 2-12 are intact.     Gait: Gait is intact.  Psychiatric:        Attention and Perception: Attention and perception normal.        Mood and Affect: Mood and affect normal.        Speech: Speech normal.        Behavior: Behavior normal. Behavior is cooperative.        Thought Content: Thought content normal.        Cognition and Memory: Cognition and memory normal.        Judgment: Judgment normal.    Assessment  Plan  Atopic dermatitis of scalp - Plan: Fluocinolone Acetonide Scalp 0.01 % OIL  Need for Tdap vaccination - Plan: Tdap (BOOSTRIX) 5-2.5-18.5 LF-MCG/0.5 injection Needed get her at CPE or at pharmacy    Sch CPE with PCP upcoming next week  Labs [facility] ordered   [facility]

## 2021-06-21 ENCOUNTER — Encounter: Payer: Self-pay | Admitting: Adult Health

## 2021-06-21 ENCOUNTER — Other Ambulatory Visit: Payer: Self-pay

## 2021-06-21 ENCOUNTER — Ambulatory Visit (INDEPENDENT_AMBULATORY_CARE_PROVIDER_SITE_OTHER): Payer: Managed Care, Other (non HMO) | Admitting: Adult Health

## 2021-06-21 VITALS — BP 116/72 | HR 71 | Temp 96.2°F | Ht 62.6 in | Wt 99.8 lb

## 2021-06-21 DIAGNOSIS — Z0000 Encounter for general adult medical examination without abnormal findings: Secondary | ICD-10-CM | POA: Diagnosis not present

## 2021-06-21 DIAGNOSIS — L209 Atopic dermatitis, unspecified: Secondary | ICD-10-CM | POA: Diagnosis not present

## 2021-06-21 DIAGNOSIS — Z23 Encounter for immunization: Secondary | ICD-10-CM

## 2021-06-21 DIAGNOSIS — Z91199 Patient's noncompliance with other medical treatment and regimen due to unspecified reason: Secondary | ICD-10-CM | POA: Insufficient documentation

## 2021-06-21 NOTE — Patient Instructions (Signed)

## 2021-06-21 NOTE — Progress Notes (Addendum)
Established Patient Office Visit  Subjective:  Patient ID: Courtney Mcdaniel, female    DOB: Apr 05, 1991  Age: 30 y.o. MRN: [id number]  CC:  Chief Complaint  Patient presents with   Annual Exam    HPI Courtney Mcdaniel presents for establish care with new provider, she saw Dr. French Ana  last week for atopic dermatitis she is trying treatment and she still requests a dermatology. She has been using prescription medication given by Dr. French Ana on 06/11/21 Complains of itchy scalp. Scalp irritation, erythema has calmed down.  Denies any concerns. She feels well otherwise.  No past mammograms.   Patient's last menstrual period was 06/11/2021 (exact date).  She has no other concerns today.  Patient  denies any fever, body aches,chills, rash, chest pain, shortness of breath, nausea, vomiting, or diarrhea.    Sees [facility] for PAP was done 05/19/2021 by Serafina Royals and advised to repeat in 5 years.   Past Medical History:  Diagnosis Date   Allergy    Anxiety    Panic attacks     Past Surgical History:  Procedure Laterality Date   NO PAST SURGERIES      Family History  Problem Relation Age of Onset   Diabetes Father    Diabetes Maternal Grandmother    Hypertension Maternal Grandmother    Heart Problems Maternal Grandmother     Social History   Socioeconomic History   Marital status: Single    Spouse name: Not on file   Number of children: Not on file   Years of education: Not on file   Highest education level: Not on file  Occupational History    Employer: [employer]  Tobacco Use   Smoking status: Never   Smokeless tobacco: Never  Vaping Use   Vaping Use: Never used  Substance and Sexual Activity   Alcohol use: Not Currently   Drug use: No   Sexual activity: Yes    Birth control/protection: Condom  Other Topics Concern   Not on file  Social History Narrative   [facility]   Social Determinants of Health   Financial Resource Strain: Not on file  Food  Insecurity: Not on file  Transportation Needs: Not on file  Physical Activity: Not on file  Stress: Not on file  Social Connections: Not on file  Intimate Partner Violence: Not on file    Outpatient Medications Prior to Visit  Medication Sig Dispense Refill   Fluocinolone Acetonide Scalp 0.01 % OIL Apply 1 application topically as directed. APPLY 1 OUNCE 1 TO 2 TIMES PER WEEK AS NEEDED OR LEAVE ON SCALP OVERNIGHT MORE THAN 4 HOURS BEFORE WASHING OUT 118 mL 0   fexofenadine-pseudoephedrine (WAL-FEX D ALLERGY & CONGESTION) 180-240 MG 24 hr tablet Take 1 tablet by mouth daily. (Patient not taking: Reported on 06/15/2021) 30 tablet 1   No facility-administered medications prior to visit.    Allergies  Allergen Reactions   Bee Pollen     ROS Review of Systems  Constitutional: Negative.   Respiratory: Negative.    Cardiovascular: Negative.   Gastrointestinal: Negative.   Genitourinary: Negative.   Musculoskeletal: Negative.   Skin:        Itchy dry mild scalp redness in areas.   Neurological: Negative.   Hematological: Negative.   Psychiatric/Behavioral: Negative.       Objective:    Physical Exam Vitals reviewed. Exam conducted with a chaperone present.  Constitutional:      General: She is not in acute distress.  Appearance: Normal appearance. She is well-developed. She is not ill-appearing, toxic-appearing or diaphoretic.     Interventions: She is not intubated. HENT:     Head: Normocephalic and atraumatic.     Jaw: There is normal jaw occlusion.     Comments: Redness and flaking to scalp.     Right Ear: Tympanic membrane, ear canal and external ear normal. There is no impacted cerumen.     Left Ear: Tympanic membrane, ear canal and external ear normal. There is no impacted cerumen.     Nose: Nose normal. No congestion or rhinorrhea.     Mouth/Throat:     Mouth: Mucous membranes are moist.     Pharynx: No oropharyngeal exudate or posterior oropharyngeal erythema.   Eyes:     General: Lids are normal. No scleral icterus.       Right eye: No discharge.        Left eye: No discharge.     Conjunctiva/sclera: Conjunctivae normal.     Right eye: Right conjunctiva is not injected. No exudate or hemorrhage.    Left eye: Left conjunctiva is not injected. No exudate or hemorrhage.    Pupils: Pupils are equal, round, and reactive to light.  Neck:     Thyroid: No thyroid mass or thyromegaly.     Vascular: Normal carotid pulses. No carotid bruit, hepatojugular reflux or JVD.     Trachea: Trachea and phonation normal. No tracheal tenderness or tracheal deviation.     Meningeal: Brudzinski's sign and Kernig's sign absent.  Cardiovascular:     Rate and Rhythm: Normal rate and regular rhythm.     Pulses: Normal pulses.          Radial pulses are 2+ on the right side and 2+ on the left side.       Dorsalis pedis pulses are 2+ on the right side and 2+ on the left side.       Posterior tibial pulses are 2+ on the right side and 2+ on the left side.     Heart sounds: Normal heart sounds, S1 normal and S2 normal. Heart sounds not distant. No murmur heard.   No friction rub. No gallop.  Pulmonary:     Effort: Pulmonary effort is normal. No tachypnea, bradypnea, accessory muscle usage or respiratory distress. She is not intubated.     Breath sounds: Normal breath sounds. No stridor. No wheezing, rhonchi or rales.  Chest:     Chest wall: No tenderness.  Breasts:    Tanner Score is 5.     Breasts are symmetrical.     Right: Normal. No swelling, bleeding, inverted nipple, mass, nipple discharge, skin change or tenderness.     Left: Normal. No inverted nipple, mass, nipple discharge, skin change or tenderness.  Abdominal:     General: Bowel sounds are normal. There is no distension or abdominal bruit.     Palpations: Abdomen is soft. There is no shifting dullness, fluid wave, hepatomegaly, splenomegaly, mass or pulsatile mass.     Tenderness: There is no abdominal  tenderness. There is no guarding or rebound.     Hernia: No hernia is present.  Genitourinary:    Comments: Deferred sees gynecology and up to date PAP Musculoskeletal:        General: No tenderness or deformity. Normal range of motion.     Cervical back: Full passive range of motion without pain, normal range of motion and neck supple. No edema, erythema or rigidity. No spinous process  tenderness or muscular tenderness. Normal range of motion.  Lymphadenopathy:     Head:     Right side of head: No submental, submandibular, tonsillar, preauricular, posterior auricular or occipital adenopathy.     Left side of head: No submental, submandibular, tonsillar, preauricular, posterior auricular or occipital adenopathy.     Cervical: No cervical adenopathy.     Right cervical: No superficial, deep or posterior cervical adenopathy.    Left cervical: No superficial, deep or posterior cervical adenopathy.     Upper Body:     Right upper body: No supraclavicular or pectoral adenopathy.     Left upper body: No supraclavicular or pectoral adenopathy.  Skin:    General: Skin is warm and dry.     Coloration: Skin is not pale.     Findings: No abrasion, bruising, burn, ecchymosis, erythema, lesion, petechiae or rash.     Nails: There is no clubbing.  Neurological:     Mental Status: She is alert and oriented to person, place, and time.     GCS: GCS eye subscore is 4. GCS verbal subscore is 5. GCS motor subscore is 6.     Cranial Nerves: No cranial nerve deficit.     Sensory: No sensory deficit.     Motor: No weakness, tremor, atrophy, abnormal muscle tone or seizure activity.     Coordination: Coordination normal.     Gait: Gait normal.     Deep Tendon Reflexes: Reflexes are normal and symmetric. Reflexes normal. Babinski sign absent on the right side. Babinski sign absent on the left side.     Reflex Scores:      Tricep reflexes are 2+ on the right side and 2+ on the left side.      Bicep reflexes  are 2+ on the right side and 2+ on the left side.      Brachioradialis reflexes are 2+ on the right side and 2+ on the left side.      Patellar reflexes are 2+ on the right side and 2+ on the left side.      Achilles reflexes are 2+ on the right side and 2+ on the left side. Psychiatric:        Mood and Affect: Mood normal.        Speech: Speech normal.        Behavior: Behavior normal.        Thought Content: Thought content normal.        Judgment: Judgment normal.    BP 116/72   Pulse 71   Temp (!) 96.2 F (35.7 C)   Ht 5' 2.6" (1.59 m)   Wt 99 lb 12.8 oz (45.3 kg)   LMP 06/11/2021 (Exact Date)   SpO2 97%   BMI 17.91 kg/m  Wt Readings from Last 3 Encounters:  06/21/21 99 lb 12.8 oz (45.3 kg)  06/15/21 99 lb 12.5 oz (45.3 kg)  05/19/20 101 lb 9.6 oz (46.1 kg)     Health Maintenance Due  Topic Date Due   TETANUS/TDAP  Never done    There are no preventive care reminders to display for this patient.  Lab Results  Component Value Date   TSH 1.070 02/18/2020   Lab Results  Component Value Date   WBC 3.1 (L) 02/18/2020   HGB 13.2 02/18/2020   HCT 39.4 02/18/2020   MCV 90 02/18/2020   PLT 203 02/18/2020   Lab Results  Component Value Date   NA 136 02/18/2020   K  4.1 02/18/2020   CO2 23 02/18/2020   GLUCOSE 69 02/18/2020   BUN 14 02/18/2020   CREATININE 0.79 02/18/2020   BILITOT 1.0 02/18/2020   ALKPHOS 30 (L) 02/18/2020   AST 22 02/18/2020   ALT 24 02/18/2020   PROT 7.0 02/18/2020   ALBUMIN 4.6 02/18/2020   CALCIUM 8.8 02/18/2020   GFR 102.14 09/03/2017   Lab Results  Component Value Date   CHOL 152 02/18/2020   Lab Results  Component Value Date   HDL 102 02/18/2020   Lab Results  Component Value Date   LDLCALC 42 02/18/2020   Lab Results  Component Value Date   TRIG 27 02/18/2020   Lab Results  Component Value Date   CHOLHDL 1.5 02/18/2020   Lab Results  Component Value Date   HGBA1C 5.0 02/18/2020      Assessment & Plan:   1.  Routine adult health maintenance Labs today fasting.  Self breast exams advised.  Return in about 1 year (around 06/21/2022), or if symptoms worsen or fail to improve, for at any time for any worsening symptoms, Go to Emergency room/ urgent care if worse.   2. Atopic dermatitis of scalp Continue treatment from last visit, return if worsening, patient requests referral.  - Ambulatory referral to Dermatology- call if not heard within 2 weeks.   3. Need for Tdap vaccination Declined today order is in.    Self breast exams advised.   Discussed health benefits of physical activity, and encouraged her to engage in regular exercise appropriate for her age and condition.     No orders of the defined types were placed in this encounter.   Follow-up: Return in about 1 year (around 06/21/2022), or if symptoms worsen or fail to improve, for at any time for any worsening symptoms, Go to Emergency room/ urgent care if worse.    Jairo Ben, FNP

## 2021-06-21 NOTE — Addendum Note (Signed)
Addended by: Berniece Pap on: 06/21/2021 01:56 PM   Modules accepted: Orders, Level of Service

## 2021-06-22 LAB — COMPREHENSIVE METABOLIC PANEL
ALT: 21 IU/L (ref 0–32)
AST: 20 IU/L (ref 0–40)
Albumin/Globulin Ratio: 2.1 (ref 1.2–2.2)
Albumin: 4.8 g/dL (ref 3.8–4.8)
Alkaline Phosphatase: 30 IU/L — ABNORMAL LOW (ref 44–121)
BUN/Creatinine Ratio: 26 — ABNORMAL HIGH (ref 9–23)
BUN: 20 mg/dL (ref 6–20)
Bilirubin Total: 0.8 mg/dL (ref 0.0–1.2)
CO2: 21 mmol/L (ref 20–29)
Calcium: 9.5 mg/dL (ref 8.7–10.2)
Chloride: 102 mmol/L (ref 96–106)
Creatinine, Ser: 0.76 mg/dL (ref 0.57–1.00)
Globulin, Total: 2.3 g/dL (ref 1.5–4.5)
Glucose: 69 mg/dL — ABNORMAL LOW (ref 70–99)
Potassium: 4.4 mmol/L (ref 3.5–5.2)
Sodium: 140 mmol/L (ref 134–144)
Total Protein: 7.1 g/dL (ref 6.0–8.5)
eGFR: 107 mL/min/{1.73_m2} (ref >59)

## 2021-06-22 LAB — CBC WITH DIFFERENTIAL/PLATELET
Basophils Absolute: 0 10*3/uL (ref 0.0–0.2)
Basos: 1 %
EOS (ABSOLUTE): 0.2 10*3/uL (ref 0.0–0.4)
Eos: 6 %
Hematocrit: 39.6 % (ref 34.0–46.6)
Hemoglobin: 13.8 g/dL (ref 11.1–15.9)
Immature Grans (Abs): 0 10*3/uL (ref 0.0–0.1)
Immature Granulocytes: 0 %
Lymphocytes Absolute: 1.3 10*3/uL (ref 0.7–3.1)
Lymphs: 34 %
MCH: 31.8 pg (ref 26.6–33.0)
MCHC: 34.8 g/dL (ref 31.5–35.7)
MCV: 91 fL (ref 79–97)
Monocytes Absolute: 0.3 10*3/uL (ref 0.1–0.9)
Monocytes: 8 %
Neutrophils Absolute: 2 10*3/uL (ref 1.4–7.0)
Neutrophils: 51 %
Platelets: 258 10*3/uL (ref 150–450)
RBC: 4.34 x10E6/uL (ref 3.77–5.28)
RDW: 12.2 % (ref 11.7–15.4)
WBC: 3.8 10*3/uL (ref 3.4–10.8)

## 2021-06-22 LAB — URINALYSIS, ROUTINE W REFLEX MICROSCOPIC
Bilirubin, UA: NEGATIVE
Glucose, UA: NEGATIVE
Ketones, UA: NEGATIVE
Leukocytes,UA: NEGATIVE
Nitrite, UA: NEGATIVE
Protein,UA: NEGATIVE
RBC, UA: NEGATIVE
Specific Gravity, UA: 1.02 (ref 1.005–1.030)
Urobilinogen, Ur: 0.2 mg/dL (ref 0.2–1.0)
pH, UA: 6 (ref 5.0–7.5)

## 2021-06-22 LAB — LIPID PANEL
Chol/HDL Ratio: 1.8 ratio (ref 0.0–4.4)
Cholesterol, Total: 165 mg/dL (ref 100–199)
HDL: 94 mg/dL (ref >39)
LDL Chol Calc (NIH): 61 mg/dL (ref 0–99)
Triglycerides: 46 mg/dL (ref 0–149)
VLDL Cholesterol Cal: 10 mg/dL (ref 5–40)

## 2021-06-22 LAB — VITAMIN B12: Vitamin B-12: 562 pg/mL (ref 232–1245)

## 2021-06-22 LAB — VITAMIN D 25 HYDROXY (VIT D DEFICIENCY, FRACTURES): Vit D, 25-Hydroxy: 24.3 ng/mL — ABNORMAL LOW (ref 30.0–100.0)

## 2021-06-22 LAB — TSH: TSH: 1.75 u[IU]/mL (ref 0.450–4.500)

## 2021-06-25 ENCOUNTER — Telehealth: Payer: Self-pay

## 2021-06-25 ENCOUNTER — Telehealth: Payer: Self-pay | Admitting: Adult Health

## 2021-06-25 NOTE — Progress Notes (Signed)
Sugar low  Always keep a snack Liver kidneys normal Cholesterol normal  Blood cts normal  Thyroid normal Urine normal  Vitamin D low rec D3 4000 IU daily if pregnant/breastfeeding max 2000 IU daily  B12 normal  F/u PCP physical

## 2021-06-25 NOTE — Telephone Encounter (Signed)
LMTCB in regards to lab results.  

## 2021-06-25 NOTE — Telephone Encounter (Signed)
Pt called in returning phone call. Pt called in about results. Pt would like callback

## 2021-06-26 NOTE — Telephone Encounter (Signed)
LMTCB

## 2021-06-27 NOTE — Telephone Encounter (Signed)
Pt given lab results 

## 2021-08-02 ENCOUNTER — Other Ambulatory Visit: Payer: Self-pay

## 2021-08-02 ENCOUNTER — Ambulatory Visit
Admission: EM | Admit: 2021-08-02 | Discharge: 2021-08-02 | Disposition: A | Discharge status: Home / Self Care | Payer: Managed Care, Other (non HMO) | Attending: Physician Assistant | Admitting: Physician Assistant

## 2021-08-02 ENCOUNTER — Ambulatory Visit: Payer: Managed Care, Other (non HMO) | Admitting: Adult Health

## 2021-08-02 ENCOUNTER — Encounter: Payer: Self-pay | Admitting: Emergency Medicine

## 2021-08-02 DIAGNOSIS — R509 Fever, unspecified: Secondary | ICD-10-CM | POA: Diagnosis not present

## 2021-08-02 NOTE — ED Triage Notes (Signed)
Patient states she was beginning to get body aches and a scratchy throat last night, temp was 99. Woke up and her thermometer told her she had a fever of 104. Came here, did not take any medications prior to coming. Temp in triage 98.4 orally.

## 2021-08-02 NOTE — ED Provider Notes (Signed)
[name]    CSN: [id number] Arrival date & time: 08/02/21  1114      History   Chief Complaint Chief Complaint  Patient presents with   Fever    HPI DELLENE Mcdaniel is a 30 y.o. female.   Patient here today for evaluation of possible fever. She has had some nasal congestion and body aches. She reports this morning her thermometer read 104F. She has not taken any meds and has normal temp today in triage. She is not sure if her thermometer is correct   The history is provided by the patient.   Past Medical History:  Diagnosis Date   Allergy    Anxiety    Panic attacks     Patient Active Problem List   Diagnosis Date Noted   No-show for appointment 06/21/2021   Atopic dermatitis of scalp 06/21/2021   Need for Tdap vaccination 06/21/2021   Underweight due to inadequate caloric intake 02/18/2020   Right inguinal hernia 02/18/2020   Dysuria 02/04/2020   Routine adult health maintenance 02/04/2020   Generalized abdominal pain 09/03/2017   Diarrhea 09/03/2017   Abnormal weight loss 09/03/2017   Constipation 08/19/2017    Past Surgical History:  Procedure Laterality Date   NO PAST SURGERIES      OB History     Gravida  0   Para  0   Term  0   Preterm  0   AB  0   Living  0      SAB  0   IAB  0   Ectopic  0   Multiple  0   Live Births  0            Home Medications    Prior to Admission medications   Medication Sig Start Date End Date Taking? Authorizing Provider  fexofenadine-pseudoephedrine (WAL-FEX D ALLERGY & CONGESTION) 180-240 MG 24 hr tablet Take 1 tablet by mouth daily. Patient not taking: Reported on 06/15/2021 10/12/20   [facility]-Scocuzza, Pasty Spillers, MD  Fluocinolone Acetonide Scalp 0.01 % OIL Apply 1 application topically as directed. APPLY 1 OUNCE 1 TO 2 TIMES PER WEEK AS NEEDED OR LEAVE ON SCALP OVERNIGHT MORE THAN 4 HOURS BEFORE WASHING OUT 06/15/21   [facility]-Scocuzza, Pasty Spillers, MD    Family History Family  History  Problem Relation Age of Onset   Diabetes Father    Diabetes Maternal Grandmother    Hypertension Maternal Grandmother    Heart Problems Maternal Grandmother     Social History Social History   Tobacco Use   Smoking status: Never   Smokeless tobacco: Never  Vaping Use   Vaping Use: Never used  Substance Use Topics   Alcohol use: Not Currently   Drug use: No     Allergies   Bee pollen   Review of Systems Review of Systems  Constitutional:  Positive for fever.  HENT:  Positive for congestion. Negative for ear pain and sore throat.   Eyes:  Negative for discharge and redness.  Respiratory:  Positive for cough. Negative for shortness of breath and wheezing.   Gastrointestinal:  Negative for abdominal pain, diarrhea, nausea and vomiting.    Physical Exam Triage Vital Signs ED Triage Vitals  Enc Vitals Group     BP      Pulse      Resp      Temp      Temp src      SpO2  Weight      Height      Head Circumference      Peak Flow      Pain Score      Pain Loc      Pain Edu?      Excl. in GC?    No data found.  Updated Vital Signs BP 96/67 (BP Location: Left Arm)    Pulse (!) 102    Temp 98.4 F (36.9 C) (Oral)    Resp 16    SpO2 97%     Physical Exam Vitals and nursing note reviewed.  Constitutional:      General: She is not in acute distress.    Appearance: Normal appearance. She is not ill-appearing.  HENT:     Head: Normocephalic and atraumatic.     Nose: Congestion present.  Eyes:     Conjunctiva/sclera: Conjunctivae normal.  Cardiovascular:     Rate and Rhythm: Normal rate and regular rhythm.     Heart sounds: Normal heart sounds. No murmur heard. Pulmonary:     Effort: Pulmonary effort is normal. No respiratory distress.     Breath sounds: Normal breath sounds. No wheezing, rhonchi or rales.  Skin:    General: Skin is warm and dry.  Neurological:     Mental Status: She is alert.  Psychiatric:        Mood and Affect: Mood  normal.        Thought Content: Thought content normal.     UC Treatments / Results  Labs (all labs ordered are listed, but only abnormal results are displayed) Labs Reviewed  COVID-19, FLU A+B NAA    EKG   Radiology No results found.  Procedures Procedures (including critical care time)  Medications Ordered in UC Medications - No data to display  Initial Impression / Assessment and Plan / UC Course  I have reviewed the triage vital signs and the nursing notes.  Pertinent labs & imaging results that were available during my care of the patient were reviewed by me and considered in my medical decision making (see chart for details).    Will order covid and flu screening. Discussed possibility of inaccurate temp results.  Recommend follow up with any further concerns.   Final Clinical Impressions(s) / UC Diagnoses   Final diagnoses:  Fever, unspecified   Discharge Instructions   None    ED Prescriptions   None    PDMP not reviewed this encounter.   Tomi Bamberger, PA-C 08/02/21 1203

## 2021-08-03 LAB — COVID-19, FLU A+B NAA
Influenza A, NAA: NOT DETECTED
Influenza B, NAA: NOT DETECTED
SARS-CoV-2, NAA: DETECTED — AB

## 2022-12-31 ENCOUNTER — Ambulatory Visit
Admission: EM | Admit: 2022-12-31 | Discharge: 2022-12-31 | Disposition: A | Discharge status: Home / Self Care | Payer: Managed Care, Other (non HMO) | Attending: Internal Medicine | Admitting: Internal Medicine

## 2022-12-31 DIAGNOSIS — R202 Paresthesia of skin: Secondary | ICD-10-CM

## 2022-12-31 LAB — POCT FASTING CBG KUC MANUAL ENTRY: POCT Glucose (KUC): 72 mg/dL (ref 70–99)

## 2022-12-31 NOTE — ED Triage Notes (Signed)
Pt c/o numbness and tingling  in hands pt states it started around mothers day, she thought that maybe she was just over using her hands pt states he has also noticed it in her feet. And heart burn.

## 2022-12-31 NOTE — Discharge Instructions (Signed)
Your blood work is pending.  Blood sugar was normal.  We will call if there are any abnormalities.  Please go straight to the emergency department if symptoms persist or worsen.  I have placed a referral to neurology.  Please call them if they do not call you within 48 to 72 hours to schedule the appointment.

## 2022-12-31 NOTE — ED Provider Notes (Signed)
[name]    CSN: [id number] Arrival date & time: 12/31/22  1546      History   Chief Complaint No chief complaint on file.   HPI Courtney Mcdaniel is a 32 y.o. female.   Patient presents with intermittent numbness and tingling of bilateral hands that has been present for a few weeks.  She states that she developed the same sensation in her feet today.  Patient reports that she also had a headache that she rated 5/10 on pain scale in the frontal portion of the head with numbness and tingling of hands first started.  This is now resolved and she has not had any persistent headaches.  Although, she reports that while in urgent care today she developed a left-sided jaw and head pain that lasted about 30 seconds.  She states that she has also been having 2 episodes of left-sided chest pain that she describes as a burning sensation.  Denies any current chest pain. States that it mainly occurs when she eats so she is attributing it to heart burn. Denies dizziness, blurred vision, nausea, vomiting, shortness of breath, palpitations.  Patient takes allergy medicine but does not have any other pertinent medical history and does not take any daily medications.  Denies any recent falls or head injuries.     Past Medical History:  Diagnosis Date   Allergy    Anxiety    Panic attacks     Patient Active Problem List   Diagnosis Date Noted   No-show for appointment 06/21/2021   Atopic dermatitis of scalp 06/21/2021   Need for Tdap vaccination 06/21/2021   Underweight due to inadequate caloric intake 02/18/2020   Right inguinal hernia 02/18/2020   Dysuria 02/04/2020   Routine adult health maintenance 02/04/2020   Generalized abdominal pain 09/03/2017   Diarrhea 09/03/2017   Abnormal weight loss 09/03/2017   Constipation 08/19/2017    Past Surgical History:  Procedure Laterality Date   NO PAST SURGERIES      OB History     Gravida  0   Para  0   Term  0   Preterm   0   AB  0   Living  0      SAB  0   IAB  0   Ectopic  0   Multiple  0   Live Births  0            Home Medications    Prior to Admission medications   Medication Sig Start Date End Date Taking? Authorizing Provider  fexofenadine-pseudoephedrine (WAL-FEX D ALLERGY & CONGESTION) 180-240 MG 24 hr tablet Take 1 tablet by mouth daily. Patient not taking: Reported on 06/15/2021 10/12/20   McLean-Scocuzza, Pasty Spillers, MD  Fluocinolone Acetonide Scalp 0.01 % OIL Apply 1 application topically as directed. APPLY 1 OUNCE 1 TO 2 TIMES PER WEEK AS NEEDED OR LEAVE ON SCALP OVERNIGHT MORE THAN 4 HOURS BEFORE WASHING OUT 06/15/21   McLean-Scocuzza, Pasty Spillers, MD    Family History Family History  Problem Relation Age of Onset   Diabetes Father    Diabetes Maternal Grandmother    Hypertension Maternal Grandmother    Heart Problems Maternal Grandmother     Social History Social History   Tobacco Use   Smoking status: Never   Smokeless tobacco: Never  Vaping Use   Vaping Use: Never used  Substance Use Topics   Alcohol use: Not Currently   Drug use: No  Allergies   Bee pollen   Review of Systems Review of Systems Per HPI  Physical Exam Triage Vital Signs ED Triage Vitals  Enc Vitals Group     BP 12/31/22 1606 94/65     Pulse Rate 12/31/22 1606 76     Resp 12/31/22 1606 15     Temp 12/31/22 1606 98 F (36.7 C)     Temp src --      SpO2 12/31/22 1606 98 %     Weight --      Height --      Head Circumference --      Peak Flow --      Pain Score 12/31/22 1610 0     Pain Loc --      Pain Edu? --      Excl. in GC? --    No data found.  Updated Vital Signs BP 94/65 (BP Location: Left Arm)   Pulse 76   Temp 98 F (36.7 C)   Resp 15   LMP 12/18/2022 (Exact Date)   SpO2 98%   Visual Acuity Right Eye Distance:   Left Eye Distance:   Bilateral Distance:    Right Eye Near:   Left Eye Near:    Bilateral Near:     Physical Exam Constitutional:       General: She is not in acute distress.    Appearance: Normal appearance. She is not toxic-appearing or diaphoretic.  HENT:     Head: Normocephalic and atraumatic.  Eyes:     Extraocular Movements: Extraocular movements intact.     Conjunctiva/sclera: Conjunctivae normal.     Pupils: Pupils are equal, round, and reactive to light.  Cardiovascular:     Rate and Rhythm: Normal rate and regular rhythm.     Pulses: Normal pulses.     Heart sounds: Normal heart sounds.  Pulmonary:     Effort: Pulmonary effort is normal. No respiratory distress.     Breath sounds: No stridor. No wheezing, rhonchi or rales.  Neurological:     General: No focal deficit present.     Mental Status: She is alert and oriented to person, place, and time. Mental status is at baseline.     Cranial Nerves: Cranial nerves 2-12 are intact.     Sensory: Sensation is intact.     Motor: Motor function is intact.     Coordination: Coordination is intact.     Gait: Gait is intact.  Psychiatric:        Mood and Affect: Mood normal.        Behavior: Behavior normal.        Thought Content: Thought content normal.        Judgment: Judgment normal.      UC Treatments / Results  Labs (all labs ordered are listed, but only abnormal results are displayed) Labs Reviewed  POCT FASTING CBG [facility] MANUAL ENTRY - Normal  CBC  COMPREHENSIVE METABOLIC PANEL  TSH  VITAMIN B12    EKG   Radiology No results found.  Procedures Procedures (including critical care time)  Medications Ordered in UC Medications - No data to display  Initial Impression / Assessment and Plan / UC Course  I have reviewed the triage vital signs and the nursing notes.  Pertinent labs & imaging results that were available during my care of the patient were reviewed by me and considered in my medical decision making (see chart for details).     Recommended to patient  ER evaluation as she most likely needs MRI of the head.  Although, patient  declined going to the ER.  Risks associated with not going to ER were discussed with patient.  Patient voiced understanding and accepted risks.  Given patient is declining ER, will do limited workup here in urgent care.  Suggested EKG to evaluate left-sided chest pain but she declined this as well.  Patient was agreeable to blood glucose and basic blood work today.  Glucose was unremarkable today.  Will obtain CMP, CBC, TSH, vitamin B12.  Family medicine appointment was made for patient today for 6/12 and neurology referral was made for patient as well.  Advised patient of strict ER precautions.  Patient verbalized understanding and was agreeable with plan. Final Clinical Impressions(s) / UC Diagnoses   Final diagnoses:  Paresthesia of both feet  Paresthesia of both hands     Discharge Instructions      Your blood work is pending.  Blood sugar was normal.  We will call if there are any abnormalities.  Please go straight to the emergency department if symptoms persist or worsen.  I have placed a referral to neurology.  Please call them if they do not call you within 48 to 72 hours to schedule the appointment.     ED Prescriptions   None    PDMP not reviewed this encounter.   Gustavus Bryant, [place] 12/31/22 [phone]

## 2023-01-01 ENCOUNTER — Emergency Department (HOSPITAL_COMMUNITY): Payer: Managed Care, Other (non HMO)

## 2023-01-01 ENCOUNTER — Other Ambulatory Visit: Payer: Self-pay

## 2023-01-01 ENCOUNTER — Emergency Department (HOSPITAL_COMMUNITY)
Admission: EM | Admit: 2023-01-01 | Discharge: 2023-01-01 | Disposition: A | Discharge status: Home / Self Care | Payer: Managed Care, Other (non HMO) | Attending: Emergency Medicine | Admitting: Emergency Medicine

## 2023-01-01 ENCOUNTER — Encounter (HOSPITAL_COMMUNITY): Payer: Self-pay

## 2023-01-01 DIAGNOSIS — R202 Paresthesia of skin: Secondary | ICD-10-CM

## 2023-01-01 DIAGNOSIS — R200 Anesthesia of skin: Secondary | ICD-10-CM | POA: Diagnosis present

## 2023-01-01 LAB — COMPREHENSIVE METABOLIC PANEL
ALT: 31 U/L (ref 0–44)
ALT: 28 IU/L (ref 0–32)
AST: 31 U/L (ref 15–41)
AST: 33 IU/L (ref 0–40)
Albumin/Globulin Ratio: 1.9 (ref 1.2–2.2)
Albumin: 3.9 g/dL (ref 3.5–5.0)
Albumin: 4.4 g/dL (ref 3.9–4.9)
Alkaline Phosphatase: 28 U/L — ABNORMAL LOW (ref 38–126)
Alkaline Phosphatase: 32 IU/L — ABNORMAL LOW (ref 44–121)
Anion gap: 6 (ref 5–15)
BUN/Creatinine Ratio: 20 (ref 9–23)
BUN: 22 mg/dL — ABNORMAL HIGH (ref 6–20)
BUN: 18 mg/dL (ref 6–20)
Bilirubin Total: 0.9 mg/dL (ref 0.0–1.2)
CO2: 26 mmol/L (ref 22–32)
CO2: 22 mmol/L (ref 20–29)
Calcium: 9.3 mg/dL (ref 8.9–10.3)
Calcium: 9.9 mg/dL (ref 8.7–10.2)
Chloride: 105 mmol/L (ref 98–111)
Chloride: 101 mmol/L (ref 96–106)
Creatinine, Ser: 0.88 mg/dL (ref 0.44–1.00)
Creatinine, Ser: 0.88 mg/dL (ref 0.57–1.00)
GFR, Estimated: >60 mL/min (ref >60)
Globulin, Total: 2.3 g/dL (ref 1.5–4.5)
Glucose, Bld: 80 mg/dL (ref 70–99)
Glucose: 71 mg/dL (ref 70–99)
Potassium: 3.5 mmol/L (ref 3.5–5.1)
Potassium: 3.9 mmol/L (ref 3.5–5.2)
Sodium: 137 mmol/L (ref 135–145)
Sodium: 136 mmol/L (ref 134–144)
Total Bilirubin: 1.2 mg/dL (ref 0.3–1.2)
Total Protein: 6.7 g/dL (ref 6.5–8.1)
Total Protein: 6.7 g/dL (ref 6.0–8.5)
eGFR: 89 mL/min/{1.73_m2} (ref >59)

## 2023-01-01 LAB — CBC
HCT: 36.7 % (ref 36.0–46.0)
Hematocrit: 39.1 % (ref 34.0–46.6)
Hemoglobin: 12.4 g/dL (ref 12.0–15.0)
Hemoglobin: 12.7 g/dL (ref 11.1–15.9)
MCH: 31.2 pg (ref 26.0–34.0)
MCH: 30.6 pg (ref 26.6–33.0)
MCHC: 33.8 g/dL (ref 30.0–36.0)
MCHC: 32.5 g/dL (ref 31.5–35.7)
MCV: 92.2 fL (ref 80.0–100.0)
MCV: 94 fL (ref 79–97)
Platelets: 210 10*3/uL (ref 150–400)
Platelets: 232 10*3/uL (ref 150–450)
RBC: 3.98 MIL/uL (ref 3.87–5.11)
RBC: 4.15 x10E6/uL (ref 3.77–5.28)
RDW: 13.8 % (ref 11.5–15.5)
RDW: 12.7 % (ref 11.7–15.4)
WBC: 4.4 10*3/uL (ref 4.0–10.5)
WBC: 4.6 10*3/uL (ref 3.4–10.8)
nRBC: 0 % (ref 0.0–0.2)

## 2023-01-01 LAB — MAGNESIUM: Magnesium: 2.2 mg/dL (ref 1.7–2.4)

## 2023-01-01 LAB — VITAMIN B12: Vitamin B-12: 603 pg/mL (ref 232–1245)

## 2023-01-01 LAB — TSH: TSH: 1.14 u[IU]/mL (ref 0.450–4.500)

## 2023-01-01 NOTE — ED Triage Notes (Signed)
Pt arrives c/o tingling with intermittent numbness to bilateral hands. Symptoms started with a headache on Mothers Day, then a week later she developed these paresthesias. States symptoms have progressively worsened. Pt states that she's experiencing this daily. Also states that she has some tingling in her feet.

## 2023-01-01 NOTE — ED Provider Notes (Signed)
[facility] Emergency Department Provider Note MRN:  [id number]  Arrival date & time: 01/01/23     Chief Complaint   Paresthesia History of Present Illness   Courtney Mcdaniel is a 32 y.o. year-old female with a history of anxiety presenting to the ED with chief complaint of paresthesia.  Tingling sensation in the bilateral feet for several days.  Noticed the same sensation in bilateral hands for the past day or 2.  Had a few seconds of pain to the left jaw, then she had a few seconds of pain to the right eye today.  Went to urgent care.  Also had a headache when the tingling in the feet happened but does not currently have a headache.  Review of Systems  A thorough review of systems was obtained and all systems are negative except as noted in the HPI and PMH.   Patient's Health History    Past Medical History:  Diagnosis Date   Allergy    Anxiety    Panic attacks     Past Surgical History:  Procedure Laterality Date   NO PAST SURGERIES      Family History  Problem Relation Age of Onset   Diabetes Father    Diabetes Maternal Grandmother    Hypertension Maternal Grandmother    Heart Problems Maternal Grandmother     Social History   Socioeconomic History   Marital status: Single    Spouse name: Not on file   Number of children: Not on file   Years of education: Not on file   Highest education level: Not on file  Occupational History    Employer: [employer]  Tobacco Use   Smoking status: Never   Smokeless tobacco: Never  Vaping Use   Vaping Use: Never used  Substance and Sexual Activity   Alcohol use: Not Currently   Drug use: No   Sexual activity: Yes    Birth control/protection: Condom  Other Topics Concern   Not on file  Social History Narrative   [facility]   Social Determinants of Health   Financial Resource Strain: Not on file  Food Insecurity: Not on file  Transportation Needs: Not on file  Physical Activity: Not on file   Stress: Not on file  Social Connections: Not on file  Intimate Partner Violence: Not on file     Physical Exam   Vitals:   01/01/23 0027  BP: 114/81  Pulse: 75  Resp: 18  Temp: 98.2 F (36.8 C)  SpO2: 100%    CONSTITUTIONAL: Well-appearing, NAD NEURO/PSYCH:  Alert and oriented x 3, normal and symmetric strength and sensation, normal coordination, normal speech EYES:  eyes equal and reactive ENT/NECK:  no LAD, no JVD CARDIO: Regular rate, well-perfused, normal S1 and S2 PULM:  CTAB no wheezing or rhonchi GI/GU:  non-distended, non-tender MSK/SPINE:  No gross deformities, no edema SKIN:  no rash, atraumatic   *Additional and/or pertinent findings included in MDM below  Diagnostic and Interventional Summary    EKG Interpretation  Date/Time:    Ventricular Rate:    PR Interval:    QRS Duration:   QT Interval:    QTC Calculation:   R Axis:     Text Interpretation:         Labs Reviewed  CBC  COMPREHENSIVE METABOLIC PANEL  MAGNESIUM    CT HEAD WO CONTRAST ( )    (Results Pending)    Medications - No data to display   Procedures  /  Critical Care Procedures  ED Course and Medical Decision Making  Initial Impression and Ddx Suspect symptoms largely driven by anxiety.  Highly doubt a CNS lesion to explain patient's symptoms.  On exam she has normal strength and sensation, she currently does not have paresthesias to her hands or feet.  She says that there is 1 area on her third knuckle of her left hand that feels a bit odd.  Currently does not have headache.  Possibly she had a complex migraine.  Other consideration includes electrolyte disturbance given the paresthesia.  Past medical/surgical history that increases complexity of ED encounter: Anxiety  Interpretation of Diagnostics I personally reviewed the laboratory assessment and my interpretation is as follows: No significant blood count or electrolyte disturbance  CT head is normal  Patient  Reassessment and Ultimate Disposition/Management     Appropriate for discharge.  She will follow-up with PCP, neurology.  Patient management required discussion with the following services or consulting groups:  None  Complexity of Problems Addressed Acute illness or injury that poses threat of life of bodily function  Additional Data Reviewed and Analyzed Further history obtained from: Recent Consult notes  Additional Factors Impacting ED Encounter Risk None  Elmer Sow. Pilar Plate, MD [facility] Emergency Medicine [facility] [email or web address]  Final Clinical Impressions(s) / ED Diagnoses     ICD-10-CM   1. Paresthesia  R20.2       ED Discharge Orders     None        Discharge Instructions Discussed with and Provided to Patient:   Discharge Instructions   None      Sabas Sous, MD 01/01/23 [phone]

## 2023-01-01 NOTE — Discharge Instructions (Signed)
You were evaluated in the Emergency Department and after careful evaluation, we did not find any emergent condition requiring admission or further testing in the hospital.  Your exam/testing today is overall reassuring.  Normal blood work and a normal scan of your brain.  Recommend continued follow-up with a primary care doctor and/or a neurologist.  Please return to the Emergency Department if you experience any worsening of your condition.   Thank you for allowing [place] to be a part of your care.

## 2023-01-15 ENCOUNTER — Ambulatory Visit: Payer: Managed Care, Other (non HMO) | Admitting: Internal Medicine

## 2023-01-27 ENCOUNTER — Ambulatory Visit: Payer: Managed Care, Other (non HMO) | Admitting: Internal Medicine

## 2023-12-11 ENCOUNTER — Telehealth: Payer: Self-pay

## 2023-12-11 NOTE — Telephone Encounter (Signed)
 Good morning,    The front office received a message stating you would like to establish care with a new provider here. We would be happy to assist you with that. Please call [phone] and we will help you schedule that visit. We left a voicemail for you as well. We're looking forward to hearing back from you.    Thank you  E2C2, if this pt calls back, please help them schedule their TOC. [facility]

## 2023-12-11 NOTE — Telephone Encounter (Signed)
 Copied from CRM [id number]. Topic: General - Other >> Dec 11, 2023  8:22 AM Annelle Kiel wrote: Reason for CRM: patient is needing to establish care with a dr in the office but epic is only going out to may 7 for dr arnett she would like a call back regarding this

## 2023-12-11 NOTE — Telephone Encounter (Signed)
 Noted.
